# Patient Record
Sex: Female | Born: 1965
Health system: Southern US, Community
[De-identification: ages and names within clinical notes are randomized; demographics above are authoritative.]

## PROBLEM LIST (undated history)

## (undated) DIAGNOSIS — E039 Hypothyroidism, unspecified: Secondary | ICD-10-CM

## (undated) DIAGNOSIS — Z808 Family history of malignant neoplasm of other organs or systems: Secondary | ICD-10-CM

## (undated) DIAGNOSIS — F419 Anxiety disorder, unspecified: Secondary | ICD-10-CM

## (undated) DIAGNOSIS — I10 Essential (primary) hypertension: Secondary | ICD-10-CM

## (undated) DIAGNOSIS — R011 Cardiac murmur, unspecified: Secondary | ICD-10-CM

## (undated) DIAGNOSIS — T7840XA Allergy, unspecified, initial encounter: Secondary | ICD-10-CM

## (undated) DIAGNOSIS — Z803 Family history of malignant neoplasm of breast: Secondary | ICD-10-CM

## (undated) DIAGNOSIS — Z8042 Family history of malignant neoplasm of prostate: Secondary | ICD-10-CM

## (undated) DIAGNOSIS — N390 Urinary tract infection, site not specified: Secondary | ICD-10-CM

## (undated) DIAGNOSIS — C50919 Malignant neoplasm of unspecified site of unspecified female breast: Secondary | ICD-10-CM

## (undated) HISTORY — DX: Essential (primary) hypertension: I10

## (undated) HISTORY — PX: COSMETIC SURGERY: SHX468

## (undated) HISTORY — DX: Urinary tract infection, site not specified: N39.0

## (undated) HISTORY — DX: Cardiac murmur, unspecified: R01.1

## (undated) HISTORY — PX: BREAST SURGERY: SHX581

## (undated) HISTORY — DX: Allergy, unspecified, initial encounter: T78.40XA

## (undated) HISTORY — DX: Anxiety disorder, unspecified: F41.9

## (undated) HISTORY — DX: Malignant neoplasm of unspecified site of unspecified female breast: C50.919

## (undated) HISTORY — DX: Family history of malignant neoplasm of breast: Z80.3

## (undated) HISTORY — PX: TUBAL LIGATION: SHX77

## (undated) HISTORY — DX: Family history of malignant neoplasm of prostate: Z80.42

## (undated) HISTORY — DX: Family history of malignant neoplasm of other organs or systems: Z80.8

## (undated) HISTORY — PX: ABDOMINAL HYSTERECTOMY: SHX81

## (undated) HISTORY — DX: Hypothyroidism, unspecified: E03.9

---

## 2001-12-10 ENCOUNTER — Ambulatory Visit (HOSPITAL_COMMUNITY): Admission: RE | Admit: 2001-12-10 | Discharge: 2001-12-10 | Payer: Self-pay | Admitting: Obstetrics and Gynecology

## 2001-12-10 ENCOUNTER — Encounter: Payer: Self-pay | Admitting: Obstetrics and Gynecology

## 2004-05-18 ENCOUNTER — Other Ambulatory Visit: Admission: RE | Admit: 2004-05-18 | Discharge: 2004-05-18 | Payer: Self-pay | Admitting: Obstetrics and Gynecology

## 2006-06-27 ENCOUNTER — Other Ambulatory Visit: Admission: RE | Admit: 2006-06-27 | Discharge: 2006-06-27 | Payer: Self-pay | Admitting: Obstetrics and Gynecology

## 2006-12-31 ENCOUNTER — Inpatient Hospital Stay (HOSPITAL_COMMUNITY): Admission: RE | Admit: 2006-12-31 | Discharge: 2007-01-03 | Payer: Self-pay | Admitting: Obstetrics and Gynecology

## 2007-01-06 ENCOUNTER — Encounter: Admission: RE | Admit: 2007-01-06 | Discharge: 2007-01-28 | Payer: Self-pay | Admitting: Obstetrics and Gynecology

## 2010-10-16 ENCOUNTER — Encounter: Payer: Self-pay | Admitting: Obstetrics and Gynecology

## 2013-05-14 ENCOUNTER — Other Ambulatory Visit: Payer: Self-pay | Admitting: Internal Medicine

## 2013-05-14 DIAGNOSIS — R220 Localized swelling, mass and lump, head: Secondary | ICD-10-CM

## 2013-05-20 ENCOUNTER — Other Ambulatory Visit: Payer: Self-pay

## 2013-05-20 ENCOUNTER — Ambulatory Visit
Admission: RE | Admit: 2013-05-20 | Discharge: 2013-05-20 | Disposition: A | Discharge status: Home / Self Care | Payer: BC Managed Care – PPO | Source: Ambulatory Visit | Attending: Internal Medicine | Admitting: Internal Medicine

## 2013-05-20 DIAGNOSIS — R220 Localized swelling, mass and lump, head: Secondary | ICD-10-CM

## 2014-10-26 ENCOUNTER — Encounter: Payer: Self-pay | Admitting: Internal Medicine

## 2014-10-26 ENCOUNTER — Other Ambulatory Visit (INDEPENDENT_AMBULATORY_CARE_PROVIDER_SITE_OTHER): Payer: 59

## 2014-10-26 ENCOUNTER — Ambulatory Visit (INDEPENDENT_AMBULATORY_CARE_PROVIDER_SITE_OTHER): Payer: 59 | Admitting: Internal Medicine

## 2014-10-26 VITALS — BP 138/92 | HR 71 | Temp 97.6°F | Resp 16 | Ht 64.0 in | Wt 137.0 lb

## 2014-10-26 DIAGNOSIS — K117 Disturbances of salivary secretion: Secondary | ICD-10-CM | POA: Insufficient documentation

## 2014-10-26 DIAGNOSIS — R682 Dry mouth, unspecified: Secondary | ICD-10-CM

## 2014-10-26 DIAGNOSIS — R5382 Chronic fatigue, unspecified: Secondary | ICD-10-CM | POA: Insufficient documentation

## 2014-10-26 DIAGNOSIS — R599 Enlarged lymph nodes, unspecified: Secondary | ICD-10-CM

## 2014-10-26 DIAGNOSIS — R591 Generalized enlarged lymph nodes: Secondary | ICD-10-CM

## 2014-10-26 LAB — COMPREHENSIVE METABOLIC PANEL
ALBUMIN: 5 g/dL (ref 3.5–5.2)
ALT: 12 U/L (ref 0–35)
AST: 17 U/L (ref 0–37)
Alkaline Phosphatase: 52 U/L (ref 39–117)
BILIRUBIN TOTAL: 0.7 mg/dL (ref 0.2–1.2)
BUN: 12 mg/dL (ref 6–23)
CHLORIDE: 102 meq/L (ref 96–112)
CO2: 25 meq/L (ref 19–32)
CREATININE: 0.72 mg/dL (ref 0.40–1.20)
Calcium: 9.9 mg/dL (ref 8.4–10.5)
GFR: 91.41 mL/min (ref >60.00)
Glucose, Bld: 108 mg/dL — ABNORMAL HIGH (ref 70–99)
POTASSIUM: 4.3 meq/L (ref 3.5–5.1)
SODIUM: 137 meq/L (ref 135–145)
Total Protein: 7.8 g/dL (ref 6.0–8.3)

## 2014-10-26 LAB — TSH: TSH: 2.93 u[IU]/mL (ref 0.35–4.50)

## 2014-10-26 LAB — CBC
HCT: 38.7 % (ref 36.0–46.0)
Hemoglobin: 13.1 g/dL (ref 12.0–15.0)
MCHC: 33.9 g/dL (ref 30.0–36.0)
MCV: 82.4 fl (ref 78.0–100.0)
PLATELETS: 272 10*3/uL (ref 150.0–400.0)
RBC: 4.7 Mil/uL (ref 3.87–5.11)
RDW: 14.5 % (ref 11.5–15.5)
WBC: 5.2 10*3/uL (ref 4.0–10.5)

## 2014-10-26 LAB — LIPID PANEL
CHOLESTEROL: 214 mg/dL — AB (ref 0–200)
HDL: 81.8 mg/dL (ref >39.00)
LDL Cholesterol: 113 mg/dL — ABNORMAL HIGH (ref 0–99)
NonHDL: 132.2
TRIGLYCERIDES: 96 mg/dL (ref 0.0–149.0)
Total CHOL/HDL Ratio: 3
VLDL: 19.2 mg/dL (ref 0.0–40.0)

## 2014-10-26 LAB — HEMOGLOBIN A1C: HEMOGLOBIN A1C: 6 % (ref 4.6–6.5)

## 2014-10-26 LAB — HIV ANTIBODY (ROUTINE TESTING W REFLEX): HIV 1&2 Ab, 4th Generation: NONREACTIVE

## 2014-10-26 NOTE — Assessment & Plan Note (Addendum)
For details about constellation of symptoms see note. Unclear to me if she needs the adderall or if some other medication would be more appropriate. Check CBC, HIV, CMP, HgA1c, TSH. [place] from 2014 reviewed and no abnormalities in the appearance of the thyroid or any lymph nodes noted. Cannot feel any lymph nodes on exam and no signs of ENT infection. She has not seen a doctor in a long time and is overdue for screening which we will discuss more later this month when she comes for her new patient evaluation.

## 2014-10-26 NOTE — Progress Notes (Signed)
Subjective:    Patient ID: Ariel Tran, female    DOB: Mar 21, 1966, 49 y.o.   MRN: [id number]  HPI The patient is a 49 YO female who is coming in today for an acute complaint which has been going on for 2 years. Unable to review family medical history as patient has not filled out and has never been here before but she says that "there is a lot of cancer in the family". She had an episode of mono about 2 years ago and since then has been having what she describes as swelling in her lymph nodes in her neck and around her collar bone (for the last 3-4 months). This pain keeps her awake at night time. It is 4-5/10 and is an aching pain. She has also noticed that she has felt very fatigued and constipated (struggled with her whole life but worse lately). Most of her care has been at various urgent cares and am unable to review any of these records. It is listed in our computer that she had neck US in 2014 that showed normal thyroid and no lymph nodes. She denies associated fevers, chills, weight loss. She has gained some weight lately (unable to quantify how much). She also is having the problem of dry mouth. It is unclear how long this has been going on but she does have quite a few dental problems (has had problems most of her life) and bad breath noted by several family members. Her dentist told her to get checked for some cause of the dry mouth. She does take adderall for ADHD at times (not regularly).  Review of Systems  Constitutional: Positive for fatigue and unexpected weight change. Negative for fever, chills, activity change and appetite change.  Eyes: Negative.   Respiratory: Negative for cough, chest tightness, shortness of breath and wheezing.   Cardiovascular: Negative for chest pain, palpitations and leg swelling.  Gastrointestinal: Positive for constipation. Negative for nausea, abdominal pain, diarrhea and abdominal distention.  Endocrine: Negative.   Musculoskeletal: Positive for  myalgias and neck pain. Negative for arthralgias, gait problem and neck stiffness.  Skin: Negative.   Neurological: Positive for headaches. Negative for dizziness, weakness and light-headedness.  Psychiatric/Behavioral: Positive for sleep disturbance and decreased concentration. Negative for suicidal ideas, confusion, self-injury, dysphoric mood and agitation. The patient is hyperactive.       Objective:   Physical Exam  Constitutional: She is oriented to person, place, and time. She appears well-developed and well-nourished.  HENT:  Head: Normocephalic and atraumatic.  Right Ear: External ear normal.  Left Ear: External ear normal.  Nose: Nose normal.  Oropharynx slightly dry  Eyes: EOM are normal.  Neck: Normal range of motion. Neck supple. No tracheal deviation present. No thyromegaly present.  Cardiovascular: Normal rate and regular rhythm.   Pulmonary/Chest: Effort normal and breath sounds normal. No respiratory distress. She has no wheezes. She has no rales.  Abdominal: Soft. Bowel sounds are normal.  Musculoskeletal: She exhibits no edema.  Lymphadenopathy:    She has no cervical adenopathy.  Neurological: She is alert and oriented to person, place, and time. Coordination normal.  Skin: Skin is warm and dry.  3 areas of scab on the back of her neck, no adenopathy noted over the region where she was tender.   Psychiatric:  Speech slightly pressured and scattered   Filed Vitals:   10/26/14 0816  BP: 138/92  Pulse: 71  Temp: 97.6 F (36.4 C)  TempSrc: Oral  Resp: 16  Height: 5\' 4"  (1.626 m)  Weight: 137 lb (62.143 kg)  SpO2: 97%      Assessment & Plan:

## 2014-10-26 NOTE — Patient Instructions (Signed)
We do not think that you need antibiotics today. Your lymph nodes are not swollen in your neck or around your collar bone. We will check blood work today to make sure we are not missing a source of infection or the dry mouth.   We can discuss the results with you on the phone and when you come back later this month.   We can advised heating pads on your neck and collar bone to see if it helps with the pain. Likely the adderall is causing the dry mouth and it may be worthwhile to see if you can come off it.   Sore or Dry Mouth Care A sore or dry mouth may happen for many different reasons. Sometimes, treatment for other health problems may have to stop until your sore or dry mouth gets better.  HOME CARE  Do not smoke or chew tobacco.  Use fake (artificial) saliva when your mouth feels dry.  Use a humidifier in your bedroom at night.  Eat small meals and snacks.  Eat food cold or at room temperature.  Suck on ice-chips or try frozen ice pops or juice bars, ice-cream, and watermelon. Do not have citrus flavors.  Suck on hard, sugarless, sour candy, or chew sugarless gum to help make more saliva.  Eat soft foods such as yogurt, bananas, canned fruit, mashed potatoes, oatmeal, rice, eggs, cottage cheese, macaroni and cheese, jello, and pudding.  Microwave vegetables and fruits to soften them.  Puree cooked food in a blender if needed.  Make dry food moist by using olive oil, gravy, or mild sauces. Dip foods in liquids.  Keep a glass of water or squirt bottle nearby. Take sips often throughout the day.  Limit caffeine.  Avoid:  Pop or fizzy drinks.  Alcohol.  Citrus juices.  Acidic food.  Salty or spicy food.  Foods or drinks that are very hot.  Hard or crunchy food. Mouth Care  Wash your hands well with soap and water before doing mouth care.  Use fake saliva as told by your doctor.  Use medicine on the sore places.  Brush your teeth at least 2 times a day.  Brush after each meal if possible. Rinse your mouth with water after each meal and after drinking a sweet drink.  Brush slowly and gently in small circles. Do not brush side-to-side.  Use regular toothpastes, but stay away from ones that have sodium Nelta sulfate in them.  Gargle with a baking soda mouthwash ( teaspoon baking soda mixed in with 4 cups of water).  Gargle with medicated mouthwash.  Use dental floss or dental tape to clean between your teeth every day.  Use a lanolin-based lip balm to keep your lips from getting dry.  If you wear dentures or bridges:  You may need to leave them out until your doctor tells you to start wearing them again.  Take them out at night if you wear them daily. Soak them in warm water or denture solution. Take your dentures out as much as you can during the day. Take them out when you use mouthwash.  After each meal, brush your gums gently with a soft brush and rinse your mouth with water.  If your dentures rub on your gums and cause a sore spot, have your dentist check and fix your dentures right away. GET HELP RIGHT AWAY IF:   Your mouth gets more painful or dry.  You have questions. MAKE SURE YOU:  Understand these instructions.  Will watch your condition.  Will get help right away if you are not doing well or get worse. Document Released: 07/09/2009 Document Revised: 12/04/2011 Document Reviewed: 07/09/2009 [facility] Patient Information 2015 [place], [place]. This information is not intended to replace advice given to you by your health care provider. Make sure you discuss any questions you have with your health care provider.

## 2014-10-26 NOTE — Assessment & Plan Note (Signed)
Unable to find any true lymph nodes on exam which are enlarged. Will await lab work up. Concern for HIV with lymphadenopathy or blood dyscrasia and check CBC.

## 2014-10-26 NOTE — Progress Notes (Signed)
Pre visit review using our clinic review tool, if applicable. No additional management support is needed unless otherwise documented below in the visit note. 

## 2014-10-26 NOTE — Assessment & Plan Note (Addendum)
Talked with her about the fact that this is likely a medication side effect from adderall (long term use 35% experience). Have asked her to stop taking it if she is able. She does have dental problems and wondering if some TMJ or dental problems could be causing the pain around her ears. She can continue using biotene.

## 2014-11-18 ENCOUNTER — Ambulatory Visit (INDEPENDENT_AMBULATORY_CARE_PROVIDER_SITE_OTHER): Payer: 59 | Admitting: Internal Medicine

## 2014-11-18 ENCOUNTER — Encounter: Payer: Self-pay | Admitting: Internal Medicine

## 2014-11-18 VITALS — BP 128/82 | HR 60 | Temp 98.4°F | Resp 14 | Ht 64.0 in | Wt 134.0 lb

## 2014-11-18 DIAGNOSIS — K117 Disturbances of salivary secretion: Secondary | ICD-10-CM

## 2014-11-18 DIAGNOSIS — Z0000 Encounter for general adult medical examination without abnormal findings: Secondary | ICD-10-CM

## 2014-11-18 DIAGNOSIS — R682 Dry mouth, unspecified: Secondary | ICD-10-CM

## 2014-11-18 NOTE — Patient Instructions (Signed)
I will email an infectious disease doctor that I know to see if he has any recommendations for what we should do next.   We do not need any blood work today.   We will see you back next year for your physical or sooner if you have any problems or questions. The cholesterol is actually good and I think you can keep up with the good eating habits and exercising.  On your birthday this year you are due for a colon cancer screening or colonoscopy. We can talk more about it next time or if you want to come back after your birthday we can talk about it then.   Health Maintenance Adopting a healthy lifestyle and getting preventive care can go a long way to promote health and wellness. Talk with your health care provider about what schedule of regular examinations is right for you. This is a good chance for you to check in with your provider about disease prevention and staying healthy. In between checkups, there are plenty of things you can do on your own. Experts have done a lot of research about which lifestyle changes and preventive measures are most likely to keep you healthy. Ask your health care provider for more information. WEIGHT AND DIET  Eat a healthy diet  Be sure to include plenty of vegetables, fruits, low-fat dairy products, and lean protein.  Do not eat a lot of foods high in solid fats, added sugars, or salt.  Get regular exercise. This is one of the most important things you can do for your health.  Most adults should exercise for at least 150 minutes each week. The exercise should increase your heart rate and make you sweat (moderate-intensity exercise).  Most adults should also do strengthening exercises at least twice a week. This is in addition to the moderate-intensity exercise.  Maintain a healthy weight  Body mass index (BMI) is a measurement that can be used to identify possible weight problems. It estimates body fat based on height and weight. Your health care provider  can help determine your BMI and help you achieve or maintain a healthy weight.  For females 34 years of age and older:   A BMI below 18.5 is considered underweight.  A BMI of 18.5 to 24.9 is normal.  A BMI of 25 to 29.9 is considered overweight.  A BMI of 30 and above is considered obese.  Watch levels of cholesterol and blood lipids  You should start having your blood tested for lipids and cholesterol at 50 years of age, then have this test every 5 years.  You may need to have your cholesterol levels checked more often if:  Your lipid or cholesterol levels are high.  You are older than 50 years of age.  You are at high risk for heart disease.  CANCER SCREENING   Lung Cancer  Lung cancer screening is recommended for adults 75-51 years old who are at high risk for lung cancer because of a history of smoking.  A yearly low-dose CT scan of the lungs is recommended for people who:  Currently smoke.  Have quit within the past 15 years.  Have at least a 30-pack-year history of smoking. A pack year is smoking an average of one pack of cigarettes a day for 1 year.  Yearly screening should continue until it has been 15 years since you quit.  Yearly screening should stop if you develop a health problem that would prevent you from having lung cancer  treatment.  Breast Cancer  Practice breast self-awareness. This means understanding how your breasts normally appear and feel.  It also means doing regular breast self-exams. Let your health care provider know about any changes, no matter how small.  If you are in your 20s or 30s, you should have a clinical breast exam (CBE) by a health care provider every 1-3 years as part of a regular health exam.  If you are 74 or older, have a CBE every year. Also consider having a breast X-ray (mammogram) every year.  If you have a family history of breast cancer, talk to your health care provider about genetic screening.  If you are at  high risk for breast cancer, talk to your health care provider about having an MRI and a mammogram every year.  Breast cancer gene (BRCA) assessment is recommended for women who have family members with BRCA-related cancers. BRCA-related cancers include:  Breast.  Ovarian.  Tubal.  Peritoneal cancers.  Results of the assessment will determine the need for genetic counseling and BRCA1 and BRCA2 testing. Cervical Cancer Routine pelvic examinations to screen for cervical cancer are no longer recommended for nonpregnant women who are considered low risk for cancer of the pelvic organs (ovaries, uterus, and vagina) and who do not have symptoms. A pelvic examination may be necessary if you have symptoms including those associated with pelvic infections. Ask your health care provider if a screening pelvic exam is right for you.   The Pap test is the screening test for cervical cancer for women who are considered at risk.  If you had a hysterectomy for a problem that was not cancer or a condition that could lead to cancer, then you no longer need Pap tests.  If you are older than 65 years, and you have had normal Pap tests for the past 10 years, you no longer need to have Pap tests.  If you have had past treatment for cervical cancer or a condition that could lead to cancer, you need Pap tests and screening for cancer for at least 20 years after your treatment.  If you no longer get a Pap test, assess your risk factors if they change (such as having a new sexual partner). This can affect whether you should start being screened again.  Some women have medical problems that increase their chance of getting cervical cancer. If this is the case for you, your health care provider may recommend more frequent screening and Pap tests.  The human papillomavirus (HPV) test is another test that may be used for cervical cancer screening. The HPV test looks for the virus that can cause cell changes in the  cervix. The cells collected during the Pap test can be tested for HPV.  The HPV test can be used to screen women 40 years of age and older. Getting tested for HPV can extend the interval between normal Pap tests from three to five years.  An HPV test also should be used to screen women of any age who have unclear Pap test results.  After 50 years of age, women should have HPV testing as often as Pap tests.  Colorectal Cancer  This type of cancer can be detected and often prevented.  Routine colorectal cancer screening usually begins at 50 years of age and continues through 50 years of age.  Your health care provider may recommend screening at an earlier age if you have risk factors for colon cancer.  Your health care provider may also  recommend using home test kits to check for hidden blood in the stool.  A small camera at the end of a tube can be used to examine your colon directly (sigmoidoscopy or colonoscopy). This is done to check for the earliest forms of colorectal cancer.  Routine screening usually begins at age 26.  Direct examination of the colon should be repeated every 5-10 years through 50 years of age. However, you may need to be screened more often if early forms of precancerous polyps or small growths are found. Skin Cancer  Check your skin from head to toe regularly.  Tell your health care provider about any new moles or changes in moles, especially if there is a change in a mole's shape or color.  Also tell your health care provider if you have a mole that is larger than the size of a pencil eraser.  Always use sunscreen. Apply sunscreen liberally and repeatedly throughout the day.  Protect yourself by wearing long sleeves, pants, a wide-brimmed hat, and sunglasses whenever you are outside. HEART DISEASE, DIABETES, AND HIGH BLOOD PRESSURE   Have your blood pressure checked at least every 1-2 years. High blood pressure causes heart disease and increases the risk  of stroke.  If you are between 52 years and 58 years old, ask your health care provider if you should take aspirin to prevent strokes.  Have regular diabetes screenings. This involves taking a blood sample to check your fasting blood sugar level.  If you are at a normal weight and have a low risk for diabetes, have this test once every three years after 50 years of age.  If you are overweight and have a high risk for diabetes, consider being tested at a younger age or more often. PREVENTING INFECTION  Hepatitis B  If you have a higher risk for hepatitis B, you should be screened for this virus. You are considered at high risk for hepatitis B if:  You were born in a country where hepatitis B is common. Ask your health care provider which countries are considered high risk.  Your parents were born in a high-risk country, and you have not been immunized against hepatitis B (hepatitis B vaccine).  You have HIV or AIDS.  You use needles to inject street drugs.  You live with someone who has hepatitis B.  You have had sex with someone who has hepatitis B.  You get hemodialysis treatment.  You take certain medicines for conditions, including cancer, organ transplantation, and autoimmune conditions. Hepatitis C  Blood testing is recommended for:  Everyone born from 50 through 1966.  Anyone with known risk factors for hepatitis C. Sexually transmitted infections (STIs)  You should be screened for sexually transmitted infections (STIs) including gonorrhea and chlamydia if:  You are sexually active and are younger than 50 years of age.  You are older than 50 years of age and your health care provider tells you that you are at risk for this type of infection.  Your sexual activity has changed since you were last screened and you are at an increased risk for chlamydia or gonorrhea. Ask your health care provider if you are at risk.  If you do not have HIV, but are at risk, it may be  recommended that you take a prescription medicine daily to prevent HIV infection. This is called pre-exposure prophylaxis (PrEP). You are considered at risk if:  You are sexually active and do not regularly use condoms or know the HIV status  of your partner(s).  You take drugs by injection.  You are sexually active with a partner who has HIV. Talk with your health care provider about whether you are at high risk of being infected with HIV. If you choose to begin PrEP, you should first be tested for HIV. You should then be tested every 3 months for as long as you are taking PrEP.  PREGNANCY   If you are premenopausal and you may become pregnant, ask your health care provider about preconception counseling.  If you may become pregnant, take 400 to 800 micrograms (mcg) of folic acid every day.  If you want to prevent pregnancy, talk to your health care provider about birth control (contraception). OSTEOPOROSIS AND MENOPAUSE   Osteoporosis is a disease in which the bones lose minerals and strength with aging. This can result in serious bone fractures. Your risk for osteoporosis can be identified using a bone density scan.  If you are 23 years of age or older, or if you are at risk for osteoporosis and fractures, ask your health care provider if you should be screened.  Ask your health care provider whether you should take a calcium or vitamin D supplement to lower your risk for osteoporosis.  Menopause may have certain physical symptoms and risks.  Hormone replacement therapy may reduce some of these symptoms and risks. Talk to your health care provider about whether hormone replacement therapy is right for you.  HOME CARE INSTRUCTIONS   Schedule regular health, dental, and eye exams.  Stay current with your immunizations.   Do not use any tobacco products including cigarettes, chewing tobacco, or electronic cigarettes.  If you are pregnant, do not drink alcohol.  If you are  breastfeeding, limit how much and how often you drink alcohol.  Limit alcohol intake to no more than 1 drink per day for nonpregnant women. One drink equals 12 ounces of beer, 5 ounces of wine, or 1 ounces of hard liquor.  Do not use street drugs.  Do not share needles.  Ask your health care provider for help if you need support or information about quitting drugs.  Tell your health care provider if you often feel depressed.  Tell your health care provider if you have ever been abused or do not feel safe at home. Document Released: 03/27/2011 Document Revised: 01/26/2014 Document Reviewed: 08/13/2013 [facility] Patient Information 2015 [place], [place]. This information is not intended to replace advice given to you by your health care provider. Make sure you discuss any questions you have with your health care provider.

## 2014-11-18 NOTE — Progress Notes (Signed)
Pre visit review using our clinic review tool, if applicable. No additional management support is needed unless otherwise documented below in the visit note. 

## 2014-11-19 ENCOUNTER — Encounter: Payer: Self-pay | Admitting: Internal Medicine

## 2014-11-19 DIAGNOSIS — Z0000 Encounter for general adult medical examination without abnormal findings: Secondary | ICD-10-CM | POA: Insufficient documentation

## 2014-11-19 NOTE — Assessment & Plan Note (Signed)
Declines flu and tetanus today. Spoke with her about colon cancer screening at 72. Labs reviewed with patient during our visit. BP okay, non-smoker. Exercises regularly and eats healthy. Reminded that she is overdue for pap smear. Talked with her about mammogram at 22 as well.

## 2014-11-19 NOTE — Progress Notes (Signed)
   Subjective:    Patient ID: Ariel Tran, female    DOB: 11/30/1965, 49 y.o.   MRN: [id number]  HPI The patient is a 49 YO female who is coming in for wellness. She is still having the same problem with dryness of her mouth but has been unable to stop her adderall (states her adhd specialist told her she was not allowed to drive without it). She is also still having the same pains in the neck region behind her ear. She was concerned about her lab results from last visit and so we reviewed those in detail. No other new complaints.   Review of Systems  Constitutional: Positive for fatigue. Negative for fever, chills, activity change and appetite change.  Eyes: Negative.   Respiratory: Negative for cough, chest tightness, shortness of breath and wheezing.   Cardiovascular: Negative for chest pain, palpitations and leg swelling.  Gastrointestinal: Negative for nausea, abdominal pain, diarrhea and abdominal distention.  Endocrine: Negative.   Musculoskeletal: Positive for myalgias and neck pain. Negative for arthralgias, gait problem and neck stiffness.  Skin: Negative.   Neurological: Negative for dizziness, weakness and light-headedness.  Psychiatric/Behavioral: Positive for sleep disturbance and decreased concentration. Negative for suicidal ideas, confusion, self-injury, dysphoric mood and agitation. The patient is hyperactive.       Objective:   Physical Exam  Constitutional: She is oriented to person, place, and time. She appears well-developed and well-nourished.  HENT:  Head: Normocephalic and atraumatic.  Right Ear: External ear normal.  Left Ear: External ear normal.  Nose: Nose normal.  Oropharynx slightly dry  Eyes: EOM are normal.  Neck: Normal range of motion. Neck supple. No tracheal deviation present. No thyromegaly present.  Cardiovascular: Normal rate and regular rhythm.   Pulmonary/Chest: Effort normal and breath sounds normal. No respiratory distress. She has no  wheezes. She has no rales.  Abdominal: Soft. Bowel sounds are normal.  Musculoskeletal: She exhibits no edema.  Lymphadenopathy:    She has no cervical adenopathy.  Neurological: She is alert and oriented to person, place, and time. Coordination normal.  Skin: Skin is warm and dry.  no adenopathy noted over the region where she was tender.   Psychiatric:  Speech slightly pressured and scattered   Filed Vitals:   11/18/14 1112  BP: 128/82  Pulse: 60  Temp: 98.4 F (36.9 C)  TempSrc: Oral  Resp: 14  Height: 5\' 4"  (1.626 m)  Weight: 134 lb (60.782 kg)  SpO2: 99%      Assessment & Plan:  Patient declined immunizations today.

## 2014-11-19 NOTE — Assessment & Plan Note (Signed)
Has not been able to stop adderall and she has no intention of stopping. Reminded her that this could be the cause of her symptoms.

## 2015-01-12 ENCOUNTER — Encounter: Payer: Self-pay | Admitting: Internal Medicine

## 2015-01-12 ENCOUNTER — Ambulatory Visit (INDEPENDENT_AMBULATORY_CARE_PROVIDER_SITE_OTHER): Payer: 59 | Admitting: Internal Medicine

## 2015-01-12 VITALS — BP 122/82 | HR 80 | Temp 98.6°F | Ht 64.0 in | Wt 131.5 lb

## 2015-01-12 DIAGNOSIS — J0110 Acute frontal sinusitis, unspecified: Secondary | ICD-10-CM | POA: Diagnosis not present

## 2015-01-12 MED ORDER — AMOXICILLIN 500 MG PO CAPS: 500.0000 mg | ORAL_CAPSULE | Freq: Three times a day (TID) | ORAL | Status: DC

## 2015-01-12 NOTE — Progress Notes (Signed)
   Subjective:    Patient ID: Ariel Tran, female    DOB: [birth year], 49 y.o.   MRN: [id number]  HPI Her symptoms began 01/07/15 as allergy symptoms. She subsequently has developed nasal congestion, nasal obstruction, nasal purulence, fatigue, frontal headache, and otic pain. Additionally she has a significant sore throat and nonproductive cough. She has developed laryngitis. She's continued to have some itchy, watery eyes.  She's been using zinc for the sore throat as well as Nasacort, Allegra, nasal saline, and Advil.  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  Review of Systems She is not having fever, chills, or sweats. No dyspnea or wheezing.     Objective:   Physical Exam General appearance:Adequately nourished; no acute distress or increased work of breathing is present. She's markedly hoarse with a raspy voice.  Lymphatic: No  lymphadenopathy about the head, neck, or axilla .  Eyes: No conjunctival inflammation or lid edema is present. There is no scleral icterus.  Ears:  External ear exam shows no significant lesions or deformities.  Otoscopic examination reveals clear canals, tympanic membranes are intact bilaterally without bulging, retraction, inflammation or discharge.  Nose:  External nasal examination shows no deformity or inflammation.   There is marked erythema of the nasal septum.No septal dislocation or deviation.No obstruction to airflow.   Oral exam: Dental hygiene is good; lips and gums are healthy appearing.There is no oropharyngeal erythema or exudate .  Neck:  No deformities, thyromegaly, masses, or tenderness noted.   Supple with full range of motion without pain.   Heart:  Normal rate and regular rhythm. S1 and S2 normal without gallop, murmur, click, rub or other extra sounds.   Lungs:Chest clear to auscultation; no wheezes, rhonchi,rales ,or rubs present.  Extremities:  No cyanosis, edema, or clubbing  noted    Skin: Warm  & dry w/o tenting or jaundice. No significant lesions or rash.     Assessment & Plan:  #1 acute frontal sinusitis  #2 allergic rhinitis  Plan: See orders and recommendations

## 2015-01-12 NOTE — Patient Instructions (Signed)

## 2015-01-12 NOTE — Progress Notes (Signed)
Pre visit review using our clinic review tool, if applicable. No additional management support is needed unless otherwise documented below in the visit note. 

## 2015-04-05 ENCOUNTER — Encounter: Payer: Self-pay | Admitting: Internal Medicine

## 2015-04-05 DIAGNOSIS — R42 Dizziness and giddiness: Secondary | ICD-10-CM

## 2015-12-27 ENCOUNTER — Telehealth: Payer: Self-pay | Admitting: Internal Medicine

## 2015-12-27 NOTE — Telephone Encounter (Signed)
I have scheduled pt an appointment through [facility] on 5/1.  She replied back    "I would like to have lab work done too! I'd like to do 3 blood test on my thyroid please, t3, t4 and Tsh. Also my A1 test again and a complete blood count. Can I come in early one morning before my appointment? Like 7am? That would be great! Blood test take awhile. If I can't then I will wait. Thanks again, [place]" I will let her know Dr. Sharlet Salina is out for the week and we will call her back with Dr. Nathanial Millman response.

## 2015-12-28 NOTE — Telephone Encounter (Signed)
Called and left a message on voice mail informing patient that Dr. Sharlet Salina will order any labs during the office visit.

## 2016-01-24 ENCOUNTER — Encounter: Payer: Self-pay | Admitting: Internal Medicine

## 2016-01-24 ENCOUNTER — Ambulatory Visit (INDEPENDENT_AMBULATORY_CARE_PROVIDER_SITE_OTHER): Payer: 59 | Admitting: Internal Medicine

## 2016-01-24 VITALS — BP 112/64 | HR 78 | Temp 98.6°F | Resp 14 | Ht 64.0 in | Wt 134.0 lb

## 2016-01-24 DIAGNOSIS — Z0000 Encounter for general adult medical examination without abnormal findings: Secondary | ICD-10-CM

## 2016-01-24 NOTE — Progress Notes (Signed)
Pre visit review using our clinic review tool, if applicable. No additional management support is needed unless otherwise documented below in the visit note. 

## 2016-01-24 NOTE — Patient Instructions (Signed)
We will have you come back before work for the labs some day. Our lab opens at 7:30 AM.   Think about getting the colon cancer screening done if your insurance will cover it.   Health Maintenance, Female Adopting a healthy lifestyle and getting preventive care can go a long way to promote health and wellness. Talk with your health care provider about what schedule of regular examinations is right for you. This is a good chance for you to check in with your provider about disease prevention and staying healthy. In between checkups, there are plenty of things you can do on your own. Experts have done a lot of research about which lifestyle changes and preventive measures are most likely to keep you healthy. Ask your health care provider for more information. WEIGHT AND DIET  Eat a healthy diet  Be sure to include plenty of vegetables, fruits, low-fat dairy products, and lean protein.  Do not eat a lot of foods high in solid fats, added sugars, or salt.  Get regular exercise. This is one of the most important things you can do for your health.  Most adults should exercise for at least 150 minutes each week. The exercise should increase your heart rate and make you sweat (moderate-intensity exercise).  Most adults should also do strengthening exercises at least twice a week. This is in addition to the moderate-intensity exercise.  Maintain a healthy weight  Body mass index (BMI) is a measurement that can be used to identify possible weight problems. It estimates body fat based on height and weight. Your health care provider can help determine your BMI and help you achieve or maintain a healthy weight.  For females 90 years of age and older:   A BMI below 18.5 is considered underweight.  A BMI of 18.5 to 24.9 is normal.  A BMI of 25 to 29.9 is considered overweight.  A BMI of 30 and above is considered obese.  Watch levels of cholesterol and blood lipids  You should start having your  blood tested for lipids and cholesterol at 51 years of age, then have this test every 5 years.  You may need to have your cholesterol levels checked more often if:  Your lipid or cholesterol levels are high.  You are older than 51 years of age.  You are at high risk for heart disease.  CANCER SCREENING   Lung Cancer  Lung cancer screening is recommended for adults 5-36 years old who are at high risk for lung cancer because of a history of smoking.  A yearly low-dose CT scan of the lungs is recommended for people who:  Currently smoke.  Have quit within the past 15 years.  Have at least a 30-pack-year history of smoking. A pack year is smoking an average of one pack of cigarettes a day for 1 year.  Yearly screening should continue until it has been 15 years since you quit.  Yearly screening should stop if you develop a health problem that would prevent you from having lung cancer treatment.  Breast Cancer  Practice breast self-awareness. This means understanding how your breasts normally appear and feel.  It also means doing regular breast self-exams. Let your health care provider know about any changes, no matter how small.  If you are in your 20s or 30s, you should have a clinical breast exam (CBE) by a health care provider every 1-3 years as part of a regular health exam.  If you are 40 or  older, have a CBE every year. Also consider having a breast X-ray (mammogram) every year.  If you have a family history of breast cancer, talk to your health care provider about genetic screening.  If you are at high risk for breast cancer, talk to your health care provider about having an MRI and a mammogram every year.  Breast cancer gene (BRCA) assessment is recommended for women who have family members with BRCA-related cancers. BRCA-related cancers include:  Breast.  Ovarian.  Tubal.  Peritoneal cancers.  Results of the assessment will determine the need for genetic  counseling and BRCA1 and BRCA2 testing. Cervical Cancer Your health care provider may recommend that you be screened regularly for cancer of the pelvic organs (ovaries, uterus, and vagina). This screening involves a pelvic examination, including checking for microscopic changes to the surface of your cervix (Pap test). You may be encouraged to have this screening done every 3 years, beginning at age 55.  For women ages 72-65, health care providers may recommend pelvic exams and Pap testing every 3 years, or they may recommend the Pap and pelvic exam, combined with testing for human papilloma virus (HPV), every 5 years. Some types of HPV increase your risk of cervical cancer. Testing for HPV may also be done on women of any age with unclear Pap test results.  Other health care providers may not recommend any screening for nonpregnant women who are considered low risk for pelvic cancer and who do not have symptoms. Ask your health care provider if a screening pelvic exam is right for you.  If you have had past treatment for cervical cancer or a condition that could lead to cancer, you need Pap tests and screening for cancer for at least 20 years after your treatment. If Pap tests have been discontinued, your risk factors (such as having a new sexual partner) need to be reassessed to determine if screening should resume. Some women have medical problems that increase the chance of getting cervical cancer. In these cases, your health care provider may recommend more frequent screening and Pap tests. Colorectal Cancer  This type of cancer can be detected and often prevented.  Routine colorectal cancer screening usually begins at 51 years of age and continues through 51 years of age.  Your health care provider may recommend screening at an earlier age if you have risk factors for colon cancer.  Your health care provider may also recommend using home test kits to check for hidden blood in the stool.  A  small camera at the end of a tube can be used to examine your colon directly (sigmoidoscopy or colonoscopy). This is done to check for the earliest forms of colorectal cancer.  Routine screening usually begins at age 40.  Direct examination of the colon should be repeated every 5-10 years through 51 years of age. However, you may need to be screened more often if early forms of precancerous polyps or small growths are found. Skin Cancer  Check your skin from head to toe regularly.  Tell your health care provider about any new moles or changes in moles, especially if there is a change in a mole's shape or color.  Also tell your health care provider if you have a mole that is larger than the size of a pencil eraser.  Always use sunscreen. Apply sunscreen liberally and repeatedly throughout the day.  Protect yourself by wearing long sleeves, pants, a wide-brimmed hat, and sunglasses whenever you are outside. HEART DISEASE, DIABETES,  AND HIGH BLOOD PRESSURE   High blood pressure causes heart disease and increases the risk of stroke. High blood pressure is more likely to develop in:  People who have blood pressure in the high end of the normal range (130-139/85-89 mm Hg).  People who are overweight or obese.  People who are African American.  If you are 19-7 years of age, have your blood pressure checked every 3-5 years. If you are 78 years of age or older, have your blood pressure checked every year. You should have your blood pressure measured twice--once when you are at a hospital or clinic, and once when you are not at a hospital or clinic. Record the average of the two measurements. To check your blood pressure when you are not at a hospital or clinic, you can use:  An automated blood pressure machine at a pharmacy.  A home blood pressure monitor.  If you are between 55 years and 34 years old, ask your health care provider if you should take aspirin to prevent strokes.  Have  regular diabetes screenings. This involves taking a blood sample to check your fasting blood sugar level.  If you are at a normal weight and have a low risk for diabetes, have this test once every three years after 52 years of age.  If you are overweight and have a high risk for diabetes, consider being tested at a younger age or more often. PREVENTING INFECTION  Hepatitis B  If you have a higher risk for hepatitis B, you should be screened for this virus. You are considered at high risk for hepatitis B if:  You were born in a country where hepatitis B is common. Ask your health care provider which countries are considered high risk.  Your parents were born in a high-risk country, and you have not been immunized against hepatitis B (hepatitis B vaccine).  You have HIV or AIDS.  You use needles to inject street drugs.  You live with someone who has hepatitis B.  You have had sex with someone who has hepatitis B.  You get hemodialysis treatment.  You take certain medicines for conditions, including cancer, organ transplantation, and autoimmune conditions. Hepatitis C  Blood testing is recommended for:  Everyone born from [birth year] through 1966.  Anyone with known risk factors for hepatitis C. Sexually transmitted infections (STIs)  You should be screened for sexually transmitted infections (STIs) including gonorrhea and chlamydia if:  You are sexually active and are younger than 51 years of age.  You are older than 52 years of age and your health care provider tells you that you are at risk for this type of infection.  Your sexual activity has changed since you were last screened and you are at an increased risk for chlamydia or gonorrhea. Ask your health care provider if you are at risk.  If you do not have HIV, but are at risk, it may be recommended that you take a prescription medicine daily to prevent HIV infection. This is called pre-exposure prophylaxis (PrEP). You are  considered at risk if:  You are sexually active and do not regularly use condoms or know the HIV status of your partner(s).  You take drugs by injection.  You are sexually active with a partner who has HIV. Talk with your health care provider about whether you are at high risk of being infected with HIV. If you choose to begin PrEP, you should first be tested for HIV. You should then be  tested every 3 months for as long as you are taking PrEP.  PREGNANCY   If you are premenopausal and you may become pregnant, ask your health care provider about preconception counseling.  If you may become pregnant, take 400 to 800 micrograms (mcg) of folic acid every day.  If you want to prevent pregnancy, talk to your health care provider about birth control (contraception). OSTEOPOROSIS AND MENOPAUSE   Osteoporosis is a disease in which the bones lose minerals and strength with aging. This can result in serious bone fractures. Your risk for osteoporosis can be identified using a bone density scan.  If you are 45 years of age or older, or if you are at risk for osteoporosis and fractures, ask your health care provider if you should be screened.  Ask your health care provider whether you should take a calcium or vitamin D supplement to lower your risk for osteoporosis.  Menopause may have certain physical symptoms and risks.  Hormone replacement therapy may reduce some of these symptoms and risks. Talk to your health care provider about whether hormone replacement therapy is right for you.  HOME CARE INSTRUCTIONS   Schedule regular health, dental, and eye exams.  Stay current with your immunizations.   Do not use any tobacco products including cigarettes, chewing tobacco, or electronic cigarettes.  If you are pregnant, do not drink alcohol.  If you are breastfeeding, limit how much and how often you drink alcohol.  Limit alcohol intake to no more than 1 drink per day for nonpregnant women. One  drink equals 12 ounces of beer, 5 ounces of wine, or 1 ounces of hard liquor.  Do not use street drugs.  Do not share needles.  Ask your health care provider for help if you need support or information about quitting drugs.  Tell your health care provider if you often feel depressed.  Tell your health care provider if you have ever been abused or do not feel safe at home.   This information is not intended to replace advice given to you by your health care provider. Make sure you discuss any questions you have with your health care provider.   Document Released: 03/27/2011 Document Revised: 10/02/2014 Document Reviewed: 08/13/2013 Elsevier Interactive Patient Education [employer].

## 2016-01-24 NOTE — Progress Notes (Signed)
   Subjective:    Patient ID: Ariel Tran, female    DOB: [birth year], 50 y.o.   MRN: [id number]  HPI The patient is a 50 YO female coming in for wellness. No new concerns since last visit.   PMH, [facility], social history reviewed and updated.   Review of Systems  Constitutional: Positive for fatigue. Negative for fever, chills, activity change, appetite change and unexpected weight change.  Eyes: Negative.   Respiratory: Negative for cough, chest tightness, shortness of breath and wheezing.   Cardiovascular: Negative for chest pain, palpitations and leg swelling.  Gastrointestinal: Negative for nausea, abdominal pain, diarrhea, constipation and abdominal distention.  Endocrine: Negative.   Musculoskeletal: Negative for arthralgias, gait problem and neck stiffness.  Skin: Negative.   Neurological: Negative for dizziness, weakness, light-headedness and headaches.  Psychiatric/Behavioral: Positive for sleep disturbance and decreased concentration. Negative for suicidal ideas, confusion, self-injury, dysphoric mood and agitation. The patient is hyperactive.       Objective:   Physical Exam  Constitutional: She is oriented to person, place, and time. She appears well-developed and well-nourished.  HENT:  Head: Normocephalic and atraumatic.  Right Ear: External ear normal.  Left Ear: External ear normal.  Nose: Nose normal.  Eyes: EOM are normal.  Neck: Normal range of motion. Neck supple. No tracheal deviation present. No thyromegaly present.  Cardiovascular: Normal rate and regular rhythm.   Pulmonary/Chest: Effort normal and breath sounds normal. No respiratory distress. She has no wheezes. She has no rales.  Abdominal: Soft. Bowel sounds are normal.  Musculoskeletal: She exhibits no edema.  Lymphadenopathy:    She has no cervical adenopathy.  Neurological: She is alert and oriented to person, place, and time. Coordination normal.  Skin: Skin is warm and dry.   Filed Vitals:   01/24/16 1440  BP: 112/64  Pulse: 78  Temp: 98.6 F (37 C)  TempSrc: Oral  Resp: 14  Height: 5\' 4"  (1.626 m)  Weight: 134 lb (60.782 kg)  SpO2: 98%      Assessment & Plan:

## 2016-01-24 NOTE — Assessment & Plan Note (Signed)
Counseled her about the need for colon cancer screening since she is now 64. Immunizations up to date. Checking labs. Counseled on sun safety and mole surveillance. Given screening recommendations.

## 2016-02-29 ENCOUNTER — Other Ambulatory Visit (INDEPENDENT_AMBULATORY_CARE_PROVIDER_SITE_OTHER): Payer: 59

## 2016-02-29 DIAGNOSIS — Z0000 Encounter for general adult medical examination without abnormal findings: Secondary | ICD-10-CM | POA: Diagnosis not present

## 2016-02-29 LAB — TSH: TSH: 2.78 u[IU]/mL (ref 0.35–4.50)

## 2016-02-29 LAB — COMPREHENSIVE METABOLIC PANEL
ALK PHOS: 47 U/L (ref 39–117)
ALT: 11 U/L (ref 0–35)
AST: 15 U/L (ref 0–37)
Albumin: 4.3 g/dL (ref 3.5–5.2)
BILIRUBIN TOTAL: 0.8 mg/dL (ref 0.2–1.2)
BUN: 20 mg/dL (ref 6–23)
CALCIUM: 9.2 mg/dL (ref 8.4–10.5)
CO2: 28 mEq/L (ref 19–32)
Chloride: 103 mEq/L (ref 96–112)
Creatinine, Ser: 0.68 mg/dL (ref 0.40–1.20)
GFR: 97.11 mL/min (ref >60.00)
GLUCOSE: 95 mg/dL (ref 70–99)
POTASSIUM: 4 meq/L (ref 3.5–5.1)
Sodium: 138 mEq/L (ref 135–145)
TOTAL PROTEIN: 6.9 g/dL (ref 6.0–8.3)

## 2016-02-29 LAB — LIPID PANEL
CHOL/HDL RATIO: 3
Cholesterol: 232 mg/dL — ABNORMAL HIGH (ref 0–200)
HDL: 80 mg/dL (ref >39.00)
LDL Cholesterol: 133 mg/dL — ABNORMAL HIGH (ref 0–99)
NonHDL: 151.66
TRIGLYCERIDES: 92 mg/dL (ref 0.0–149.0)
VLDL: 18.4 mg/dL (ref 0.0–40.0)

## 2016-02-29 LAB — T4, FREE: FREE T4: 0.62 ng/dL (ref 0.60–1.60)

## 2016-03-01 ENCOUNTER — Encounter: Payer: Self-pay | Admitting: Internal Medicine

## 2016-03-02 ENCOUNTER — Encounter: Payer: Self-pay | Admitting: Internal Medicine

## 2016-03-02 DIAGNOSIS — E039 Hypothyroidism, unspecified: Secondary | ICD-10-CM

## 2016-03-02 MED ORDER — LEVOTHYROXINE SODIUM 50 MCG PO TABS: 50.0000 ug | ORAL_TABLET | Freq: Every day | ORAL | Status: DC

## 2016-03-12 ENCOUNTER — Encounter: Payer: Self-pay | Admitting: Internal Medicine

## 2017-03-05 ENCOUNTER — Encounter: Payer: 59 | Admitting: Internal Medicine

## 2017-03-06 ENCOUNTER — Encounter: Payer: 59 | Admitting: Internal Medicine

## 2017-03-08 ENCOUNTER — Other Ambulatory Visit: Payer: Self-pay | Admitting: Internal Medicine

## 2017-03-12 ENCOUNTER — Encounter: Payer: Self-pay | Admitting: Nurse Practitioner

## 2017-03-12 ENCOUNTER — Other Ambulatory Visit (INDEPENDENT_AMBULATORY_CARE_PROVIDER_SITE_OTHER): Payer: 59

## 2017-03-12 ENCOUNTER — Ambulatory Visit (INDEPENDENT_AMBULATORY_CARE_PROVIDER_SITE_OTHER): Payer: 59 | Admitting: Nurse Practitioner

## 2017-03-12 VITALS — BP 124/82 | HR 70 | Temp 97.9°F | Ht 64.0 in | Wt 140.0 lb

## 2017-03-12 DIAGNOSIS — R5382 Chronic fatigue, unspecified: Secondary | ICD-10-CM | POA: Diagnosis not present

## 2017-03-12 DIAGNOSIS — Z13220 Encounter for screening for lipoid disorders: Secondary | ICD-10-CM

## 2017-03-12 DIAGNOSIS — Z136 Encounter for screening for cardiovascular disorders: Secondary | ICD-10-CM

## 2017-03-12 DIAGNOSIS — Z0001 Encounter for general adult medical examination with abnormal findings: Secondary | ICD-10-CM

## 2017-03-12 DIAGNOSIS — R739 Hyperglycemia, unspecified: Secondary | ICD-10-CM

## 2017-03-12 DIAGNOSIS — E039 Hypothyroidism, unspecified: Secondary | ICD-10-CM

## 2017-03-12 LAB — COMPREHENSIVE METABOLIC PANEL
ALT: 14 U/L (ref 0–35)
AST: 18 U/L (ref 0–37)
Albumin: 4.5 g/dL (ref 3.5–5.2)
Alkaline Phosphatase: 55 U/L (ref 39–117)
BILIRUBIN TOTAL: 1 mg/dL (ref 0.2–1.2)
BUN: 13 mg/dL (ref 6–23)
CO2: 25 meq/L (ref 19–32)
CREATININE: 0.68 mg/dL (ref 0.40–1.20)
Calcium: 9.6 mg/dL (ref 8.4–10.5)
Chloride: 104 mEq/L (ref 96–112)
GFR: 96.71 mL/min (ref >60.00)
GLUCOSE: 108 mg/dL — AB (ref 70–99)
Potassium: 4.5 mEq/L (ref 3.5–5.1)
SODIUM: 135 meq/L (ref 135–145)
Total Protein: 7.1 g/dL (ref 6.0–8.3)

## 2017-03-12 LAB — CBC
HEMATOCRIT: 38.9 % (ref 36.0–46.0)
Hemoglobin: 12.8 g/dL (ref 12.0–15.0)
MCHC: 32.9 g/dL (ref 30.0–36.0)
MCV: 81.4 fl (ref 78.0–100.0)
PLATELETS: 237 10*3/uL (ref 150.0–400.0)
RBC: 4.78 Mil/uL (ref 3.87–5.11)
RDW: 16.3 % — AB (ref 11.5–15.5)
WBC: 3.8 10*3/uL — AB (ref 4.0–10.5)

## 2017-03-12 LAB — LIPID PANEL
CHOL/HDL RATIO: 3
Cholesterol: 233 mg/dL — ABNORMAL HIGH (ref 0–200)
HDL: 73.7 mg/dL (ref >39.00)
LDL Cholesterol: 138 mg/dL — ABNORMAL HIGH (ref 0–99)
NONHDL: 159.48
Triglycerides: 106 mg/dL (ref 0.0–149.0)
VLDL: 21.2 mg/dL (ref 0.0–40.0)

## 2017-03-12 LAB — TSH: TSH: 1.33 u[IU]/mL (ref 0.35–4.50)

## 2017-03-12 LAB — HEMOGLOBIN A1C: Hgb A1c MFr Bld: 6 % (ref 4.6–6.5)

## 2017-03-12 LAB — VITAMIN B12: VITAMIN B 12: 679 pg/mL (ref 211–911)

## 2017-03-12 LAB — T4, FREE: Free T4: 0.68 ng/dL (ref 0.60–1.60)

## 2017-03-12 MED ORDER — SYNTHROID 50 MCG PO TABS: 50.0000 ug | ORAL_TABLET | Freq: Every day | ORAL | 3 refills | Status: DC

## 2017-03-12 MED ORDER — SYNTHROID 25 MCG PO TABS: 25.0000 ug | ORAL_TABLET | Freq: Every day | ORAL | 3 refills | Status: DC

## 2017-03-12 NOTE — Patient Instructions (Signed)
Please go to basement for blood draw. You will be contacted with results.  Will send prescription after review of lab results.  Please schedule mammogram and colonoscopy.  Preventive Care 40-64 Years, Female Preventive care refers to lifestyle choices and visits with your health care provider that can promote health and wellness. What does preventive care include?  A yearly physical exam. This is also called an annual well check.  Dental exams once or twice a year.  Routine eye exams. Ask your health care provider how often you should have your eyes checked.  Personal lifestyle choices, including: ? Daily care of your teeth and gums. ? Regular physical activity. ? Eating a healthy diet. ? Avoiding tobacco and drug use. ? Limiting alcohol use. ? Practicing safe sex. ? Taking low-dose aspirin daily starting at age 22. ? Taking vitamin and mineral supplements as recommended by your health care provider. What happens during an annual well check? The services and screenings done by your health care provider during your annual well check will depend on your age, overall health, lifestyle risk factors, and family history of disease. Counseling Your health care provider may ask you questions about your:  Alcohol use.  Tobacco use.  Drug use.  Emotional well-being.  Home and relationship well-being.  Sexual activity.  Eating habits.  Work and work Statistician.  Method of birth control.  Menstrual cycle.  Pregnancy history.  Screening You may have the following tests or measurements:  Height, weight, and BMI.  Blood pressure.  Lipid and cholesterol levels. These may be checked every 5 years, or more frequently if you are over 43 years old.  Skin check.  Lung cancer screening. You may have this screening every year starting at age 55 if you have a 30-pack-year history of smoking and currently smoke or have quit within the past 15 years.  Fecal occult blood test  (FOBT) of the stool. You may have this test every year starting at age 62.  Flexible sigmoidoscopy or colonoscopy. You may have a sigmoidoscopy every 5 years or a colonoscopy every 10 years starting at age 20.  Hepatitis C blood test.  Hepatitis B blood test.  Sexually transmitted disease (STD) testing.  Diabetes screening. This is done by checking your blood sugar (glucose) after you have not eaten for a while (fasting). You may have this done every 1-3 years.  Mammogram. This may be done every 1-2 years. Talk to your health care provider about when you should start having regular mammograms. This may depend on whether you have a family history of breast cancer.  BRCA-related cancer screening. This may be done if you have a family history of breast, ovarian, tubal, or peritoneal cancers.  Pelvic exam and Pap test. This may be done every 3 years starting at age 55. Starting at age 31, this may be done every 5 years if you have a Pap test in combination with an HPV test.  Bone density scan. This is done to screen for osteoporosis. You may have this scan if you are at high risk for osteoporosis.  Discuss your test results, treatment options, and if necessary, the need for more tests with your health care provider. Vaccines Your health care provider may recommend certain vaccines, such as:  Influenza vaccine. This is recommended every year.  Tetanus, diphtheria, and acellular pertussis (Tdap, Td) vaccine. You may need a Td booster every 10 years.  Varicella vaccine. You may need this if you have not been vaccinated.  Zoster  vaccine. You may need this after age 97.  Measles, mumps, and rubella (MMR) vaccine. You may need at least one dose of MMR if you were born in 1957 or later. You may also need a second dose.  Pneumococcal 13-valent conjugate (PCV13) vaccine. You may need this if you have certain conditions and were not previously vaccinated.  Pneumococcal polysaccharide (PPSV23)  vaccine. You may need one or two doses if you smoke cigarettes or if you have certain conditions.  Meningococcal vaccine. You may need this if you have certain conditions.  Hepatitis A vaccine. You may need this if you have certain conditions or if you travel or work in places where you may be exposed to hepatitis A.  Hepatitis B vaccine. You may need this if you have certain conditions or if you travel or work in places where you may be exposed to hepatitis B.  Haemophilus influenzae type b (Hib) vaccine. You may need this if you have certain conditions.  Talk to your health care provider about which screenings and vaccines you need and how often you need them. This information is not intended to replace advice given to you by your health care provider. Make sure you discuss any questions you have with your health care provider. Document Released: 10/08/2015 Document Revised: 05/31/2016 Document Reviewed: 07/13/2015 Elsevier Interactive Patient Education  2017 [employer].

## 2017-03-12 NOTE — Progress Notes (Signed)
Subjective:  Patient ID: Ariel Tran, female    DOB: 11-16-1965  Age: 51 y.o. MRN: [id number]  CC: Annual Exam (CPE--endo consult?)   HPI  denies any acute complains.  Hypothyroidism: Taking levothyroxine as prescribed (on empty stomach) Constipation and memory fog has improved with medication Complains persistent fatigue, weight gain, and generalized itching.  Will schedule PAP smear with GYN.  Will schedule colonoscopy and mammogram.  Married, home with husband and children (5 adolescents).  Depression screen PHQ 2/9 03/12/2017  Decreased Interest 0  Down, Depressed, Hopeless 0  PHQ - 2 Score 0   Outpatient Medications Prior to Visit  Medication Sig Dispense Refill  . amphetamine-dextroamphetamine (ADDERALL) 15 MG tablet Take 1 tablet by mouth 2 (two) times daily.  0  . levothyroxine (SYNTHROID, LEVOTHROID) 50 MCG tablet TAKE 1 TABLET (50 MCG TOTAL) BY MOUTH DAILY. 30 tablet 0   No facility-administered medications prior to visit.    Family History  Problem Relation Age of Onset  . Cancer Mother        breast and lung  . Hyperlipidemia Mother   . Hypothyroidism Mother   . Thyroid disease Mother   . Cancer Father        prostate  . Diabetes Maternal Uncle   . Thyroid disease Maternal Uncle   . Cancer Paternal Grandmother        ovarian  . Diabetes Paternal Grandmother   . Bleeding Disorder Sister   . Sjogren's syndrome Sister   . Thyroid disease Maternal Aunt   . Thyroid disease Maternal Grandmother   . Thyroid disease Maternal Grandfather    Social History  Substance Use Topics  . Smoking status: Never Smoker  . Smokeless tobacco: Never Used  . Alcohol use 0.0 oz/week   ROS Review of Systems  Constitutional: Positive for malaise/fatigue. Negative for fever and weight loss.  HENT: Negative for congestion and sore throat.   Eyes:       Negative for visual changes  Respiratory: Negative for cough and shortness of breath.   Cardiovascular: Negative  for chest pain, palpitations and leg swelling.  Gastrointestinal: Negative for abdominal pain, blood in stool, constipation, diarrhea, heartburn, nausea and vomiting.  Genitourinary: Negative for dysuria, frequency and urgency.  Musculoskeletal: Negative for falls, joint pain and myalgias.  Skin: Negative for rash.  Neurological: Negative for dizziness, sensory change and headaches.  Endo/Heme/Allergies: Negative for environmental allergies. Does not bruise/bleed easily.  Psychiatric/Behavioral: Negative for depression, memory loss, substance abuse and suicidal ideas. The patient is nervous/anxious.      Objective:  BP 124/82   Pulse 70   Temp 97.9 F (36.6 C)   Ht 5\' 4"  (1.626 m)   Wt 140 lb (63.5 kg)   LMP 03/08/2017   SpO2 99%   BMI 24.03 kg/m   BP Readings from Last 3 Encounters:  03/12/17 124/82  01/24/16 112/64  01/12/15 122/82    Wt Readings from Last 3 Encounters:  03/12/17 140 lb (63.5 kg)  01/24/16 134 lb (60.8 kg)  01/12/15 131 lb 8 oz (59.6 kg)    Physical Exam  Constitutional: She is oriented to person, place, and time. No distress.  HENT:  Right Ear: External ear normal.  Left Ear: External ear normal.  Nose: Nose normal.  Mouth/Throat: No oropharyngeal exudate.  Eyes: No scleral icterus.  Neck: Normal range of motion. Neck supple.  Cardiovascular: Normal rate, regular rhythm and normal heart sounds.   Pulmonary/Chest: Effort normal and breath sounds normal.  No respiratory distress.  Declined breast exam.  Abdominal: Soft. She exhibits no distension.  Genitourinary:  Genitourinary Comments: declined  Musculoskeletal: Normal range of motion. She exhibits no edema.  Lymphadenopathy:    She has no cervical adenopathy.  Neurological: She is alert and oriented to person, place, and time.  Skin: Skin is warm and dry.  Psychiatric: She has a normal mood and affect. Her behavior is normal.    Lab Results  Component Value Date   WBC 3.8 (L) 03/12/2017     HGB 12.8 03/12/2017   HCT 38.9 03/12/2017   PLT 237.0 03/12/2017   GLUCOSE 108 (H) 03/12/2017   CHOL 233 (H) 03/12/2017   TRIG 106.0 03/12/2017   HDL 73.70 03/12/2017   LDLCALC 138 (H) 03/12/2017   ALT 14 03/12/2017   AST 18 03/12/2017   NA 135 03/12/2017   K 4.5 03/12/2017   CL 104 03/12/2017   CREATININE 0.68 03/12/2017   BUN 13 03/12/2017   CO2 25 03/12/2017   TSH 1.33 03/12/2017   HGBA1C 6.0 03/12/2017    US Soft Tissue Head/neck  Result Date: 05/20/2013 *RADIOLOGY REPORT* Clinical Data: Neck swelling and fatigue. THYROID ULTRASOUND Technique: Ultrasound examination of the thyroid gland and adjacent soft tissues was performed. Comparison:  None. Findings: Right thyroid lobe:  Measures 4.0 x 1.2 x 1.4 cm and is homogeneous in echotexture. Left thyroid lobe:  Measures 3.8 x 1.2 x 1.4 cm and is homogeneous in echotexture. Isthmus:  Measures 2 mm. Focal nodules:  None. Lymphadenopathy:  None visualized. IMPRESSION: Normal exam. Original Report Authenticated By: Lorin Picket, M.D.    Assessment & Plan:   Ariel Tran was seen today for annual exam.  Diagnoses and all orders for this visit:  Encounter for preventative adult health care exam with abnormal findings -     CBC; Future -     Comprehensive metabolic panel; Future -     Lipid panel; Future  Hypothyroidism, unspecified type -     TSH; Future -     T4, free; Future -     Discontinue: SYNTHROID 25 MCG tablet; Take 1 tablet (25 mcg total) by mouth daily before breakfast. -     SYNTHROID 50 MCG tablet; Take 1 tablet (50 mcg total) by mouth daily before breakfast.  Hyperglycemia -     Hemoglobin A1c; Future  Chronic fatigue -     B12; Future -     Vitamin D 1,25 dihydroxy; Future  Encounter for lipid screening for cardiovascular disease -     Lipid panel; Future   I have discontinued Ariel Tran's levothyroxine and SYNTHROID. I am also having her start on SYNTHROID. Additionally, I am having her maintain her  amphetamine-dextroamphetamine.  Meds ordered this encounter  Medications  . DISCONTD: SYNTHROID 25 MCG tablet    Sig: Take 1 tablet (25 mcg total) by mouth daily before breakfast.    Dispense:  30 tablet    Refill:  3    Order Specific Question:   Supervising Provider    Answer:   Cassandria Anger [[id number]]  . SYNTHROID 50 MCG tablet    Sig: Take 1 tablet (50 mcg total) by mouth daily before breakfast.    Dispense:  30 tablet    Refill:  3    Order Specific Question:   Supervising Provider    Answer:   Cassandria Anger [[id number]]    Follow-up: Return in about 3 months (around 06/12/2017) for hypothyroidim.  Wilfred Lacy, NP

## 2017-03-13 ENCOUNTER — Encounter: Payer: Self-pay | Admitting: Nurse Practitioner

## 2017-03-15 ENCOUNTER — Encounter: Payer: Self-pay | Admitting: Nurse Practitioner

## 2017-03-15 LAB — VITAMIN D 1,25 DIHYDROXY
VITAMIN D3 1, 25 (OH): 50 pg/mL
Vitamin D 1, 25 (OH)2 Total: 50 pg/mL (ref 18–72)
Vitamin D2 1, 25 (OH)2: <8 pg/mL

## 2017-03-20 ENCOUNTER — Ambulatory Visit (INDEPENDENT_AMBULATORY_CARE_PROVIDER_SITE_OTHER): Payer: 59

## 2017-03-20 DIAGNOSIS — Z111 Encounter for screening for respiratory tuberculosis: Secondary | ICD-10-CM | POA: Diagnosis not present

## 2017-03-22 LAB — TB SKIN TEST
INDURATION: 0 mm
TB SKIN TEST: NEGATIVE

## 2017-05-06 NOTE — Progress Notes (Signed)
I was available to supervise the injection. A. Valaree Fresquez, MD  

## 2017-06-18 ENCOUNTER — Ambulatory Visit (INDEPENDENT_AMBULATORY_CARE_PROVIDER_SITE_OTHER)
Admission: RE | Admit: 2017-06-18 | Discharge: 2017-06-18 | Disposition: A | Discharge status: Home / Self Care | Payer: 59 | Source: Ambulatory Visit | Attending: Nurse Practitioner | Admitting: Nurse Practitioner

## 2017-06-18 ENCOUNTER — Ambulatory Visit (INDEPENDENT_AMBULATORY_CARE_PROVIDER_SITE_OTHER): Payer: 59 | Admitting: Nurse Practitioner

## 2017-06-18 ENCOUNTER — Encounter: Payer: Self-pay | Admitting: Nurse Practitioner

## 2017-06-18 VITALS — BP 146/96 | HR 78 | Temp 97.8°F | Ht 64.0 in | Wt 139.0 lb

## 2017-06-18 DIAGNOSIS — R5381 Other malaise: Secondary | ICD-10-CM

## 2017-06-18 DIAGNOSIS — R0602 Shortness of breath: Secondary | ICD-10-CM

## 2017-06-18 DIAGNOSIS — J209 Acute bronchitis, unspecified: Secondary | ICD-10-CM | POA: Diagnosis not present

## 2017-06-18 DIAGNOSIS — R5383 Other fatigue: Secondary | ICD-10-CM

## 2017-06-18 DIAGNOSIS — R05 Cough: Secondary | ICD-10-CM | POA: Diagnosis not present

## 2017-06-18 DIAGNOSIS — R059 Cough, unspecified: Secondary | ICD-10-CM

## 2017-06-18 MED ORDER — GUAIFENESIN-DM 100-10 MG/5ML PO SYRP: 5.0000 mL | ORAL_SOLUTION | ORAL | 0 refills | Status: DC | PRN

## 2017-06-18 MED ORDER — PREDNISONE 10 MG (21) PO TBPK: ORAL_TABLET | ORAL | 0 refills | Status: DC

## 2017-06-18 MED ORDER — ALBUTEROL SULFATE HFA 108 (90 BASE) MCG/ACT IN AERS: 1.0000 | INHALATION_SPRAY | Freq: Four times a day (QID) | RESPIRATORY_TRACT | 0 refills | Status: DC | PRN

## 2017-06-18 MED ORDER — HYDROCODONE-HOMATROPINE 5-1.5 MG/5ML PO SYRP: 5.0000 mL | ORAL_SOLUTION | Freq: Every evening | ORAL | 0 refills | Status: DC | PRN

## 2017-06-18 NOTE — Progress Notes (Signed)
Subjective:  Patient ID: Ariel Tran, female    DOB: Mar 26, 1966  Age: 51 y.o. 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 The current episode started 1 to 4 weeks ago. The problem has been waxing and waning. There has been no fever. Associated symptoms include congestion, coughing and rhinorrhea. Pertinent negatives include no sore throat or wheezing. Associated symptoms comments: SOB with exertion. She has tried decongestant, antihistamine, acetaminophen and increased fluids for the symptoms. The treatment provided no relief.   Outpatient Medications Prior to Visit  Medication Sig Dispense Refill  . amphetamine-dextroamphetamine (ADDERALL) 15 MG tablet Take 1 tablet by mouth 2 (two) times daily.  0  . SYNTHROID 50 MCG tablet Take 1 tablet (50 mcg total) by mouth daily before breakfast. 30 tablet 3   No facility-administered medications prior to visit.     ROS See HPI  Objective:  BP (!) 146/96   Pulse 78   Temp 97.8 F (36.6 C)   Ht 5\' 4"  (1.626 m)   Wt 139 lb (63 kg)   LMP 05/29/2017 (Approximate)   SpO2 99%   BMI 23.86 kg/m   BP Readings from Last 3 Encounters:  06/18/17 (!) 146/96  03/12/17 124/82  01/24/16 112/64    Wt Readings from Last 3 Encounters:  06/18/17 139 lb (63 kg)  03/12/17 140 lb (63.5 kg)  01/24/16 134 lb (60.8 kg)    Physical Exam  Constitutional: She is oriented to person, place, and time. No distress.  HENT:  Right Ear: Tympanic membrane, external ear and ear canal normal.  Left Ear: Tympanic membrane, external ear and ear canal normal.  Nose: Mucosal edema and rhinorrhea present. Right sinus exhibits no maxillary sinus tenderness and no frontal sinus tenderness. Left sinus exhibits no maxillary sinus tenderness and no frontal sinus tenderness.  Mouth/Throat: Uvula is midline. No trismus in the jaw. Posterior oropharyngeal  erythema present. No oropharyngeal exudate.  Eyes: No scleral icterus.  Neck: Normal range of motion. Neck supple.  Cardiovascular: Normal rate and normal heart sounds.   Pulmonary/Chest: Effort normal and breath sounds normal. She has no wheezes. She has no rales.  Musculoskeletal: She exhibits no edema.  Lymphadenopathy:    She has no cervical adenopathy.  Neurological: She is alert and oriented to person, place, and time.  Vitals reviewed.   Lab Results  Component Value Date   WBC 3.8 (L) 03/12/2017   HGB 12.8 03/12/2017   HCT 38.9 03/12/2017   PLT 237.0 03/12/2017   GLUCOSE 108 (H) 03/12/2017   CHOL 233 (H) 03/12/2017   TRIG 106.0 03/12/2017   HDL 73.70 03/12/2017   LDLCALC 138 (H) 03/12/2017   ALT 14 03/12/2017   AST 18 03/12/2017   NA 135 03/12/2017   K 4.5 03/12/2017   CL 104 03/12/2017   CREATININE 0.68 03/12/2017   BUN 13 03/12/2017   CO2 25 03/12/2017   TSH 1.33 03/12/2017   HGBA1C 6.0 03/12/2017    US Soft Tissue Head/neck  Result Date: 05/20/2013 *RADIOLOGY REPORT* Clinical Data: Neck swelling and fatigue. THYROID ULTRASOUND Technique: Ultrasound examination of the thyroid gland and adjacent soft tissues was performed. Comparison:  None. Findings: Right thyroid lobe:  Measures 4.0 x 1.2 x 1.4 cm and is homogeneous in echotexture. Left thyroid lobe:  Measures 3.8 x 1.2 x 1.4 cm and is homogeneous in echotexture. Isthmus:  Measures 2 mm.  Focal nodules:  None. Lymphadenopathy:  None visualized. IMPRESSION: Normal exam. Original Report Authenticated By: Lorin Picket, M.D.    Assessment & Plan:   Ariel Tran was seen today for cough.  Diagnoses and all orders for this visit:  Acute bronchitis, unspecified organism -     DG Chest 2 View; Future -     albuterol (PROVENTIL HFA;VENTOLIN HFA) 108 (90 Base) MCG/ACT inhaler; Inhale 1-2 puffs into the lungs every 6 (six) hours as needed for wheezing or shortness of breath. -     guaiFENesin-dextromethorphan (ROBITUSSIN  DM) 100-10 MG/5ML syrup; Take 5 mLs by mouth every 4 (four) hours as needed for cough. -     HYDROcodone-homatropine (HYCODAN) 5-1.5 MG/5ML syrup; Take 5 mLs by mouth at bedtime as needed for cough. -     predniSONE (STERAPRED UNI-PAK 21 TAB) 10 MG (21) TBPK tablet; Take by mouth as directed.  SOB (shortness of breath) -     DG Chest 2 View; Future -     albuterol (PROVENTIL HFA;VENTOLIN HFA) 108 (90 Base) MCG/ACT inhaler; Inhale 1-2 puffs into the lungs every 6 (six) hours as needed for wheezing or shortness of breath. -     guaiFENesin-dextromethorphan (ROBITUSSIN DM) 100-10 MG/5ML syrup; Take 5 mLs by mouth every 4 (four) hours as needed for cough. -     HYDROcodone-homatropine (HYCODAN) 5-1.5 MG/5ML syrup; Take 5 mLs by mouth at bedtime as needed for cough. -     predniSONE (STERAPRED UNI-PAK 21 TAB) 10 MG (21) TBPK tablet; Take by mouth as directed.  Malaise and fatigue -     DG Chest 2 View; Future   I am having Ariel Tran start on albuterol, guaiFENesin-dextromethorphan, HYDROcodone-homatropine, and predniSONE. I am also having her maintain her amphetamine-dextroamphetamine and SYNTHROID.  Meds ordered this encounter  Medications  . albuterol (PROVENTIL HFA;VENTOLIN HFA) 108 (90 Base) MCG/ACT inhaler    Sig: Inhale 1-2 puffs into the lungs every 6 (six) hours as needed for wheezing or shortness of breath.    Dispense:  1 Inhaler    Refill:  0    Order Specific Question:   Supervising Provider    Answer:   Cassandria Anger [[id number]]  . guaiFENesin-dextromethorphan (ROBITUSSIN DM) 100-10 MG/5ML syrup    Sig: Take 5 mLs by mouth every 4 (four) hours as needed for cough.    Dispense:  118 mL    Refill:  0    Order Specific Question:   Supervising Provider    Answer:   Cassandria Anger [[id number]]  . HYDROcodone-homatropine (HYCODAN) 5-1.5 MG/5ML syrup    Sig: Take 5 mLs by mouth at bedtime as needed for cough.    Dispense:  120 mL    Refill:  0    Order Specific Question:    Supervising Provider    Answer:   Cassandria Anger [[id number]]  . predniSONE (STERAPRED UNI-PAK 21 TAB) 10 MG (21) TBPK tablet    Sig: Take by mouth as directed.    Dispense:  21 tablet    Refill:  0    Order Specific Question:   Supervising Provider    Answer:   Cassandria Anger [[id number]]    Follow-up: Return if symptoms worsen or fail to improve.  Wilfred Lacy, NP

## 2017-06-18 NOTE — Patient Instructions (Addendum)
Normal CXR, treat for bronchitis. No oral abx needed at this time.  Start medications as prescribed.  Encourage adequate oral hydration.  Acute Bronchitis, Adult Acute bronchitis is when air tubes (bronchi) in the lungs suddenly get swollen. The condition can make it hard to breathe. It can also cause these symptoms:  A cough.  Coughing up clear, yellow, or green mucus.  Wheezing.  Chest congestion.  Shortness of breath.  A fever.  Body aches.  Chills.  A sore throat.  Follow these instructions at home: Medicines  Take over-the-counter and prescription medicines only as told by your doctor.  If you were prescribed an antibiotic medicine, take it as told by your doctor. Do not stop taking the antibiotic even if you start to feel better. General instructions  Rest.  Drink enough fluids to keep your pee (urine) clear or pale yellow.  Avoid smoking and secondhand smoke. If you smoke and you need help quitting, ask your doctor. Quitting will help your lungs heal faster.  Use an inhaler, cool mist vaporizer, or humidifier as told by your doctor.  Keep all follow-up visits as told by your doctor. This is important. How is this prevented? To lower your risk of getting this condition again:  Wash your hands often with soap and water. If you cannot use soap and water, use hand sanitizer.  Avoid contact with people who have cold symptoms.  Try not to touch your hands to your mouth, nose, or eyes.  Make sure to get the flu shot every year.  Contact a doctor if:  Your symptoms do not get better in 2 weeks. Get help right away if:  You cough up blood.  You have chest pain.  You have very bad shortness of breath.  You become dehydrated.  You faint (pass out) or keep feeling like you are going to pass out.  You keep throwing up (vomiting).  You have a very bad headache.  Your fever or chills gets worse. This information is not intended to replace advice  given to you by your health care provider. Make sure you discuss any questions you have with your health care provider. Document Released: 02/28/2008 Document Revised: 04/19/2016 Document Reviewed: 03/01/2016 Elsevier Interactive Patient Education  2017 [employer].

## 2017-07-21 ENCOUNTER — Encounter: Payer: Self-pay | Admitting: Nurse Practitioner

## 2017-07-23 NOTE — Telephone Encounter (Signed)
Please advise 

## 2017-07-26 ENCOUNTER — Encounter: Payer: Self-pay | Admitting: Family Medicine

## 2017-07-26 ENCOUNTER — Ambulatory Visit (INDEPENDENT_AMBULATORY_CARE_PROVIDER_SITE_OTHER): Payer: 59 | Admitting: Family Medicine

## 2017-07-26 VITALS — BP 138/64 | HR 76 | Temp 98.6°F | Ht 64.0 in | Wt 141.0 lb

## 2017-07-26 DIAGNOSIS — J0110 Acute frontal sinusitis, unspecified: Secondary | ICD-10-CM | POA: Diagnosis not present

## 2017-07-26 MED ORDER — AZITHROMYCIN 250 MG PO TABS: ORAL_TABLET | ORAL | 0 refills | Status: DC

## 2017-07-26 NOTE — Assessment & Plan Note (Signed)
Possible that she has had an ongoing infection versus just a reinfection with another viral illness. - Counseled on supportive care - Provided azithromycin - Follow-up as needed

## 2017-07-26 NOTE — Progress Notes (Signed)
Ariel Tran - 51 y.o. female MRN [id number]  Date of birth: 03-23-1966  SUBJECTIVE:  Including CC & ROS.  Chief Complaint  Patient presents with  . Sinus pressure    has been present for one week. She has also been having a lot of headaches.    [name] is a 51 y.o. female that is presenting with sinus congestion and pressure. She has had symptoms ongoing. She denies any fevers or chills. 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 She has not had any ear pain. Has had some production with her cough that has improved. She denies any nausea or vomiting.  Patient was seen on 9/24 and treated with acute bronchitis with steroids, inhaler, and cough syrup.  Review of chest x-ray from September 2018 shows normal lung volumes no active disease.   Review of Systems  Constitutional: Negative for fever.  HENT: Positive for sinus pain and sinus pressure.     HISTORY: Past Medical, Surgical, Social, and Family History Reviewed & Updated per EMR.   Pertinent Historical Findings include:  Past Medical History:  Diagnosis Date  . Allergy   . Frequent headaches   . Heart murmur   . Hypertension   . UTI (urinary tract infection)     Past Surgical History:  Procedure Laterality Date  . CESAREAN SECTION    . TUBAL LIGATION      Allergies  Allergen Reactions  . Chicken Allergy   . Citrus   . Eggs Or Egg-Derived Products     Family History  Problem Relation Age of Onset  . Cancer Mother        breast and lung  . Hyperlipidemia Mother   . Hypothyroidism Mother   . Thyroid disease Mother   . Cancer Father        prostate  . Diabetes Maternal Uncle   . Thyroid disease Maternal Uncle   . Cancer Paternal Grandmother        ovarian  . Diabetes Paternal Grandmother   . Bleeding Disorder Sister   . Sjogren's syndrome Sister   . Thyroid disease Maternal Aunt   . Thyroid disease Maternal Grandmother   . Thyroid disease Maternal Grandfather      Social  History   Social History  . Marital status: Married    Spouse name: N/A  . Number of children: N/A  . Years of education: N/A   Occupational History  . Not on file.   Social History Main Topics  . Smoking status: Never Smoker  . Smokeless tobacco: Never Used  . Alcohol use 0.0 oz/week  . Drug use: Unknown  . Sexual activity: Yes    Birth control/ protection: None, Surgical     Comment: tubal ligation   Other Topics Concern  . Not on file   Social History Narrative  . No narrative on file     PHYSICAL EXAM:  VS: BP 138/64 (BP Location: Left Arm, Patient Position: Sitting, Cuff Size: Normal)   Pulse 76   Temp 98.6 F (37 C) (Oral)   Ht 5\' 4"  (1.626 m)   Wt 141 lb (64 kg)   SpO2 100%   BMI 24.20 kg/m  Physical Exam Gen: NAD, alert, cooperative with exam, well-appearing ENT: normal lips, normal nasal mucosa, tympanic membranes clear and intact bilaterally, normal oropharynx, no cervical lymphadenopathy Eye: normal EOM, normal conjunctiva and lids CV:  no edema, +2 pedal pulses, regular rate and rhythm, S1-S2  Resp: no accessory muscle use, non-labored, clear to auscultation bilaterally, no crackles or wheezes Skin: no rashes, no areas of induration  Neuro: normal tone, normal sensation to touch Psych:  normal insight, alert and oriented MSK: Normal gait, normal strength      ASSESSMENT & PLAN:   Acute non-recurrent frontal sinusitis Possible that she has had an ongoing infection versus just a reinfection with another viral illness. - Counseled on supportive care - Provided azithromycin - Follow-up as needed

## 2017-07-26 NOTE — Patient Instructions (Signed)
Thank you for coming in,   Please take a probiotic with the antibiotic  Please try things such as zyrtec-D or allegra-D which is an antihistamine and decongestant.   Please try afrin which will help with nasal congestion but use for only three days.   Please also try using a netti pot on a regular occasion.  Honey can help with a sore throat.     Please feel free to call with any questions or concerns at any time, at [phone]. --Dr. Austen Wygant  

## 2017-08-06 ENCOUNTER — Encounter: Payer: Self-pay | Admitting: Nurse Practitioner

## 2017-08-06 DIAGNOSIS — E039 Hypothyroidism, unspecified: Secondary | ICD-10-CM

## 2017-08-06 MED ORDER — SYNTHROID 50 MCG PO TABS: 50.0000 ug | ORAL_TABLET | Freq: Every day | ORAL | 0 refills | Status: DC

## 2017-08-06 NOTE — Telephone Encounter (Signed)
Please advise, we sent in 90 days supply in 02/2017. Last ov with [facility] was 01/2016.

## 2017-09-20 ENCOUNTER — Telehealth: Payer: Self-pay | Admitting: Internal Medicine

## 2017-09-20 DIAGNOSIS — E039 Hypothyroidism, unspecified: Secondary | ICD-10-CM

## 2017-09-20 MED ORDER — SYNTHROID 50 MCG PO TABS: 50.0000 ug | ORAL_TABLET | Freq: Every day | ORAL | 0 refills | Status: DC

## 2017-09-20 NOTE — Telephone Encounter (Signed)
Per office policy sent 30 day to local pharmacy until appt.../lmb  

## 2017-09-20 NOTE — Telephone Encounter (Signed)
Pt needs a refill of her SYNTHROID 50 MCG tablet  To get her through to her appt on 1/8 Please advise  POF

## 2017-09-20 NOTE — Addendum Note (Signed)
Addended by: Earnstine Regal on: 09/20/2017 12:44 PM   Modules accepted: Orders

## 2017-10-01 ENCOUNTER — Other Ambulatory Visit: Payer: Self-pay | Admitting: Internal Medicine

## 2017-10-01 ENCOUNTER — Encounter: Payer: Self-pay | Admitting: Internal Medicine

## 2017-10-01 DIAGNOSIS — E039 Hypothyroidism, unspecified: Secondary | ICD-10-CM

## 2017-10-01 MED ORDER — SYNTHROID 50 MCG PO TABS: 50.0000 ug | ORAL_TABLET | Freq: Every day | ORAL | 1 refills | Status: DC

## 2017-10-02 ENCOUNTER — Ambulatory Visit (INDEPENDENT_AMBULATORY_CARE_PROVIDER_SITE_OTHER): Payer: BLUE CROSS/BLUE SHIELD | Admitting: Internal Medicine

## 2017-10-02 ENCOUNTER — Encounter: Payer: Self-pay | Admitting: Internal Medicine

## 2017-10-02 DIAGNOSIS — E039 Hypothyroidism, unspecified: Secondary | ICD-10-CM

## 2017-10-02 MED ORDER — LEVOTHYROXINE SODIUM 75 MCG PO TABS: 75.0000 ug | ORAL_TABLET | Freq: Every day | ORAL | 3 refills | Status: DC

## 2017-10-02 NOTE — Assessment & Plan Note (Signed)
Levels are still very low on synthroid 50 mcg daily. Will increase to 75 mcg daily. Explained to her that thyroid medication does not cause her to gain weight but that low thyroid levels can cause weight gain and difficulty losing weight. Labs ordered for 1 month.

## 2017-10-02 NOTE — Progress Notes (Signed)
   Subjective:    Patient ID: Ariel Tran, female    DOB: 11-28-1965, 52 y.o.   MRN: [id number]  HPI The patient is a 52 YO female coming in for follow up of her thyroid with problems. She thinks that the synthroid is making her gain weight. She changed to the brand name over the summer. She had testing then. She is not sure if the testing is changed. Denies cold or heat intolerance. She feels that the medicine is causing her to gain 20 pounds in the last several years and some breast swelling and tenderness. She is also experiencing perimenopausal symptoms. She denies constipation or diarrhea. She initially wanted to get off the thyroid medication.   Review of Systems  Constitutional: Positive for fatigue and unexpected weight change.  Respiratory: Negative for cough, chest tightness and shortness of breath.   Cardiovascular: Negative for chest pain, palpitations and leg swelling.  Gastrointestinal: Negative for abdominal distention, abdominal pain, constipation, diarrhea, nausea and vomiting.  Musculoskeletal: Negative.   Skin: Negative.   Neurological: Negative.   Psychiatric/Behavioral: Negative.       Objective:   Physical Exam  Constitutional: She is oriented to person, place, and time. She appears well-developed and well-nourished.  HENT:  Head: Normocephalic and atraumatic.  Eyes: EOM are normal.  Neck: Normal range of motion.  Cardiovascular: Normal rate and regular rhythm.  Pulmonary/Chest: Effort normal and breath sounds normal. No respiratory distress. She has no wheezes. She has no rales.  Abdominal: Soft.  Musculoskeletal: She exhibits no edema.  Neurological: She is alert and oriented to person, place, and time. Coordination normal.  Skin: Skin is warm and dry.   Vitals:   10/02/17 0814  BP: 124/80  Pulse: 76  Temp: 98.3 F (36.8 C)  TempSrc: Oral  SpO2: 100%  Weight: 143 lb (64.9 kg)  Height: 5\' 4"  (1.626 m)      Assessment & Plan:

## 2017-10-02 NOTE — Patient Instructions (Signed)
We have sent in the higher dose of the synthroid to take for 1 month then we can recheck the levels.

## 2017-10-30 ENCOUNTER — Encounter: Payer: Self-pay | Admitting: Family Medicine

## 2017-10-30 ENCOUNTER — Ambulatory Visit: Payer: BLUE CROSS/BLUE SHIELD | Admitting: Family Medicine

## 2017-10-30 DIAGNOSIS — R05 Cough: Secondary | ICD-10-CM | POA: Diagnosis not present

## 2017-10-30 DIAGNOSIS — R059 Cough, unspecified: Secondary | ICD-10-CM

## 2017-10-30 NOTE — Progress Notes (Signed)
HAIVYN ORAVEC - 52 y.o. female MRN [id number]  Date of birth: 1966-07-11  SUBJECTIVE:  Including CC & ROS.  Chief Complaint  Patient presents with  . Cough    MARILLYN GOREN is a 52 y.o. female that is presenting with cough, diarrhea and body aches. Ongoing for five days. Admits to fevers and chills.She has been taking Nyquil and Motrin with some improvement. She is a Freight forwarder. She states she felt crackling when she was coughing last night.     Review of Systems  Constitutional: Positive for chills and fever.  Gastrointestinal: Positive for diarrhea.  Musculoskeletal: Negative for back pain.  Hematological: Negative for adenopathy.  Psychiatric/Behavioral: Negative for agitation.    HISTORY: Past Medical, Surgical, Social, and Family History Reviewed & Updated per EMR.   Pertinent Historical Findings include:  Past Medical History:  Diagnosis Date  . Allergy   . Frequent headaches   . Heart murmur   . Hypertension   . UTI (urinary tract infection)     Past Surgical History:  Procedure Laterality Date  . CESAREAN SECTION    . TUBAL LIGATION      Allergies  Allergen Reactions  . Chicken Allergy   . Citrus   . Eggs Or Egg-Derived Products     Family History  Problem Relation Age of Onset  . Cancer Mother        breast and lung  . Hyperlipidemia Mother   . Hypothyroidism Mother   . Thyroid disease Mother   . Cancer Father        prostate  . Diabetes Maternal Uncle   . Thyroid disease Maternal Uncle   . Cancer Paternal Grandmother        ovarian  . Diabetes Paternal Grandmother   . Bleeding Disorder Sister   . Sjogren's syndrome Sister   . Thyroid disease Maternal Aunt   . Thyroid disease Maternal Grandmother   . Thyroid disease Maternal Grandfather      Social History   Socioeconomic History  . Marital status: Married    Spouse name: Not on file  . Number of children: Not on file  . Years of education: Not on file  . Highest education level:  Not on file  Social Needs  . Financial resource strain: Not on file  . Food insecurity - worry: Not on file  . Food insecurity - inability: Not on file  . Transportation needs - medical: Not on file  . Transportation needs - non-medical: Not on file  Occupational History  . Not on file  Tobacco Use  . Smoking status: Never Smoker  . Smokeless tobacco: Never Used  Substance and Sexual Activity  . Alcohol use: Yes    Alcohol/week: 0.0 oz  . Drug use: Not on file  . Sexual activity: Yes    Birth control/protection: None, Surgical    Comment: tubal ligation  Other Topics Concern  . Not on file  Social History Narrative  . Not on file     PHYSICAL EXAM:  VS: BP 140/84 (BP Location: Left Arm, Patient Position: Sitting, Cuff Size: Normal)   Pulse 68   Temp 98.1 F (36.7 C) (Oral)   Ht 5\' 4"  (1.626 m)   Wt 140 lb (63.5 kg)   SpO2 98%   BMI 24.03 kg/m  Physical Exam Gen: NAD, alert, cooperative with exam,  ENT: normal lips, normal nasal mucosa, tympanic membranes clear and intact bilaterally, normal oropharynx, no cervical lymphadenopathy Eye: normal EOM, normal  conjunctiva and lids CV:  no edema, +2 pedal pulses, regular rate and rhythm, S1-S2   Resp: no accessory muscle use, non-labored, clear to auscultation bilaterally, no crackles or wheezes GI: no masses or tenderness, no hernia  Skin: no rashes, no areas of induration  Neuro: normal tone, normal sensation to touch Psych:  normal insight, alert and oriented MSK: Normal gait, normal strength       ASSESSMENT & PLAN:   Cough Doing well now. Likely viral.  - counseled on supportive care - f/u prn

## 2017-11-03 DIAGNOSIS — R059 Cough, unspecified: Secondary | ICD-10-CM | POA: Insufficient documentation

## 2017-11-03 DIAGNOSIS — R05 Cough: Secondary | ICD-10-CM | POA: Insufficient documentation

## 2017-11-03 NOTE — Assessment & Plan Note (Addendum)
Doing well now. Likely viral.  - counseled on supportive care - f/u prn

## 2017-12-15 DIAGNOSIS — J020 Streptococcal pharyngitis: Secondary | ICD-10-CM | POA: Diagnosis not present

## 2017-12-21 ENCOUNTER — Encounter: Payer: Self-pay | Admitting: Internal Medicine

## 2017-12-21 ENCOUNTER — Other Ambulatory Visit: Payer: Self-pay | Admitting: Internal Medicine

## 2017-12-21 DIAGNOSIS — N63 Unspecified lump in unspecified breast: Secondary | ICD-10-CM

## 2017-12-21 DIAGNOSIS — Z1231 Encounter for screening mammogram for malignant neoplasm of breast: Secondary | ICD-10-CM

## 2017-12-24 ENCOUNTER — Other Ambulatory Visit: Payer: Self-pay | Admitting: Internal Medicine

## 2017-12-24 DIAGNOSIS — N63 Unspecified lump in unspecified breast: Secondary | ICD-10-CM

## 2018-01-02 ENCOUNTER — Other Ambulatory Visit: Payer: Self-pay | Admitting: Internal Medicine

## 2018-01-02 ENCOUNTER — Ambulatory Visit
Admission: RE | Admit: 2018-01-02 | Discharge: 2018-01-02 | Disposition: A | Discharge status: Home / Self Care | Payer: BLUE CROSS/BLUE SHIELD | Source: Ambulatory Visit | Attending: Internal Medicine | Admitting: Internal Medicine

## 2018-01-02 DIAGNOSIS — N6489 Other specified disorders of breast: Secondary | ICD-10-CM

## 2018-01-02 DIAGNOSIS — N6322 Unspecified lump in the left breast, upper inner quadrant: Secondary | ICD-10-CM | POA: Diagnosis not present

## 2018-01-02 DIAGNOSIS — N63 Unspecified lump in unspecified breast: Secondary | ICD-10-CM

## 2018-01-02 DIAGNOSIS — R922 Inconclusive mammogram: Secondary | ICD-10-CM | POA: Diagnosis not present

## 2018-01-03 ENCOUNTER — Ambulatory Visit
Admission: RE | Admit: 2018-01-03 | Discharge: 2018-01-03 | Disposition: A | Discharge status: Home / Self Care | Payer: BLUE CROSS/BLUE SHIELD | Source: Ambulatory Visit | Attending: Internal Medicine | Admitting: Internal Medicine

## 2018-01-03 ENCOUNTER — Other Ambulatory Visit: Payer: Self-pay | Admitting: Internal Medicine

## 2018-01-03 DIAGNOSIS — Z1231 Encounter for screening mammogram for malignant neoplasm of breast: Secondary | ICD-10-CM

## 2018-01-03 DIAGNOSIS — N6489 Other specified disorders of breast: Secondary | ICD-10-CM

## 2018-01-03 DIAGNOSIS — N6322 Unspecified lump in the left breast, upper inner quadrant: Secondary | ICD-10-CM | POA: Diagnosis not present

## 2018-01-03 DIAGNOSIS — C50212 Malignant neoplasm of upper-inner quadrant of left female breast: Secondary | ICD-10-CM | POA: Diagnosis not present

## 2018-01-07 ENCOUNTER — Encounter: Payer: Self-pay | Admitting: Internal Medicine

## 2018-01-07 ENCOUNTER — Encounter: Payer: Self-pay | Admitting: *Deleted

## 2018-01-07 ENCOUNTER — Telehealth: Payer: Self-pay | Admitting: Hematology and Oncology

## 2018-01-07 DIAGNOSIS — E039 Hypothyroidism, unspecified: Secondary | ICD-10-CM

## 2018-01-07 NOTE — Telephone Encounter (Signed)
Tried calling both patient and husband to confirm afternoon [facility] appointment for 4/17, both voice mails unable to leave message, e-mail sent instead, packet will be emailed as well

## 2018-01-08 ENCOUNTER — Telehealth: Payer: Self-pay | Admitting: Hematology and Oncology

## 2018-01-08 NOTE — Telephone Encounter (Signed)
Spoke with patient to confirm afternoon [facility] appointment for 4/17, packet emailed to patient

## 2018-01-09 ENCOUNTER — Inpatient Hospital Stay: Payer: BLUE CROSS/BLUE SHIELD | Attending: Hematology and Oncology | Admitting: Hematology and Oncology

## 2018-01-09 ENCOUNTER — Ambulatory Visit: Payer: BLUE CROSS/BLUE SHIELD | Attending: General Surgery | Admitting: Physical Therapy

## 2018-01-09 ENCOUNTER — Encounter: Payer: Self-pay | Admitting: Physical Therapy

## 2018-01-09 ENCOUNTER — Encounter: Payer: Self-pay | Admitting: *Deleted

## 2018-01-09 ENCOUNTER — Encounter: Payer: Self-pay | Admitting: General Practice

## 2018-01-09 ENCOUNTER — Encounter: Payer: Self-pay | Admitting: Hematology and Oncology

## 2018-01-09 ENCOUNTER — Ambulatory Visit
Admission: RE | Admit: 2018-01-09 | Discharge: 2018-01-09 | Disposition: A | Discharge status: Home / Self Care | Payer: BLUE CROSS/BLUE SHIELD | Source: Ambulatory Visit | Attending: Radiation Oncology | Admitting: Radiation Oncology

## 2018-01-09 ENCOUNTER — Other Ambulatory Visit: Payer: Self-pay | Admitting: *Deleted

## 2018-01-09 ENCOUNTER — Other Ambulatory Visit: Payer: Self-pay

## 2018-01-09 ENCOUNTER — Other Ambulatory Visit: Payer: BLUE CROSS/BLUE SHIELD

## 2018-01-09 DIAGNOSIS — C50212 Malignant neoplasm of upper-inner quadrant of left female breast: Secondary | ICD-10-CM | POA: Diagnosis not present

## 2018-01-09 DIAGNOSIS — Z803 Family history of malignant neoplasm of breast: Secondary | ICD-10-CM | POA: Diagnosis not present

## 2018-01-09 DIAGNOSIS — Z9189 Other specified personal risk factors, not elsewhere classified: Secondary | ICD-10-CM

## 2018-01-09 DIAGNOSIS — R293 Abnormal posture: Secondary | ICD-10-CM | POA: Diagnosis not present

## 2018-01-09 DIAGNOSIS — Z006 Encounter for examination for normal comparison and control in clinical research program: Secondary | ICD-10-CM

## 2018-01-09 DIAGNOSIS — Z170 Estrogen receptor positive status [ER+]: Principal | ICD-10-CM

## 2018-01-09 DIAGNOSIS — Z8041 Family history of malignant neoplasm of ovary: Secondary | ICD-10-CM | POA: Diagnosis not present

## 2018-01-09 MED ORDER — LORAZEPAM 1 MG PO TABS: 1.0000 mg | ORAL_TABLET | ORAL | 0 refills | Status: DC | PRN

## 2018-01-09 NOTE — Progress Notes (Signed)
Nutrition Assessment  Reason for Assessment:  Pt seen in Breast Clinic  ASSESSMENT:   52 year old female with new diagnosis of breast cancer.  Past medical history reviewed  Medications:  reviewed  Labs: none taken today  Anthropometrics:   Height: 64 inches Weight: 140 lb BMI: 24   NUTRITION DIAGNOSIS: Food and nutrition related knowledge deficit related to new diagnosis of breast cancer as evidenced by no prior need for nutrition related information.  INTERVENTION:   Discussed and provided packet of information regarding nutritional tips for breast cancer patients.  Questions answered.  Teachback method used.  Contact information provided and patient knows to contact me with questions/concerns.    MONITORING, EVALUATION, and GOAL: Pt will consume a healthy plant based diet to maintain lean body mass throughout treatment.   Ariel Tran B. Zenia Resides, [place], [place] Registered Dietitian [phone] (pager)

## 2018-01-09 NOTE — Progress Notes (Signed)
Radiation Oncology         ([phone]) [phone] ________________________________  Name: Ariel Tran        MRN: [id number]  Date of Service: 01/09/2018 DOB: [birth year]  [id number], Real Cons, MD  Stark Klein, MD     REFERRING PHYSICIAN: Stark Klein, MD   DIAGNOSIS: The encounter diagnosis was Malignant neoplasm of upper-inner quadrant of left breast in female, estrogen receptor positive ([place]).   HISTORY OF PRESENT ILLNESS: Ariel Tran is a 52 y.o. female seen in the multidisciplinary breast clinic for a new diagnosis of left breast cancer. The patient was noted to have a palpable fullness in the right breast but subsequent diagnostic images did not reveal a correlate. She did have a distortion seen in the left breast at 10:30 which measured 2.7 x 2.5 x 1.3 cm, and her axilla was negative by ultrasound. She underwent biopsy of this on 01/03/18 which revealed a grade 1-2 invasive ductal carcinoma, ER/PR positive, HER2 negative with a Ki 67 of 3%. She comes today to discus options of treatment for her cancer.    PREVIOUS RADIATION THERAPY: No   PAST MEDICAL HISTORY:  Past Medical History:  Diagnosis Date  . Allergy   . Frequent headaches   . Heart murmur   . Hypertension   . Hypothyroidism   . UTI (urinary tract infection)        PAST SURGICAL HISTORY: Past Surgical History:  Procedure Laterality Date  . CESAREAN SECTION    . TUBAL LIGATION       FAMILY HISTORY:  Family History  Problem Relation Age of Onset  . Cancer Mother        breast and lung  . Hyperlipidemia Mother   . Hypothyroidism Mother   . Thyroid disease Mother   . Breast cancer Mother   . Cancer Father        prostate  . Diabetes Maternal Uncle   . Thyroid disease Maternal Uncle   . Cancer Paternal Grandmother        ovarian  . Diabetes Paternal Grandmother   . Bleeding Disorder Sister   . Sjogren's syndrome Sister   . Thyroid disease Maternal Aunt   . Thyroid disease Maternal Grandmother     . Thyroid disease Maternal Grandfather      SOCIAL HISTORY:  reports that she has never smoked. She has never used smokeless tobacco. She reports that she drinks alcohol. She reports that she does not use drugs. The patient is married and lives in [place]. 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 She has a young daughter.    ALLERGIES: Levothyroxine sodium; Chicken allergy; Citrus; Contrast media [iodinated diagnostic agents]; and Eggs or egg-derived products   MEDICATIONS:  Current Outpatient Medications  Medication Sig Dispense Refill  . amphetamine-dextroamphetamine (ADDERALL) 15 MG tablet Take 1 tablet by mouth 2 (two) times daily.  0  . levothyroxine (SYNTHROID, LEVOTHROID) 75 MCG tablet Take 1 tablet (75 mcg total) by mouth daily. DAW 30 tablet 3  . LORazepam (ATIVAN) 1 MG tablet Take 1 tablet (1 mg total) by mouth as needed for anxiety. Take one tablet one hour prior to [facility]. Take one tablet at start of exam. 3 tablet 0  . Multiple Vitamins-Minerals (MULTIVITAMIN WITH MINERALS) tablet Take 1 tablet by mouth daily.    . Multiple Vitamins-Minerals (ZINC PO) Take by mouth daily.     No current facility-administered medications for this encounter.  REVIEW OF SYSTEMS: On review of systems, the patient reports that she is doing well overall. She denies any chest pain, shortness of breath, cough, fevers, chills, night sweats, unintended weight changes. She denies any bowel or bladder disturbances, and denies abdominal pain, nausea or vomiting. She denies any new musculoskeletal or joint aches or pains. A complete review of systems is obtained and is otherwise negative.     PHYSICAL EXAM:  Wt Readings from Last 3 Encounters:  01/09/18 140 lb 14.4 oz (63.9 kg)  10/30/17 140 lb (63.5 kg)  10/02/17 143 lb (64.9 kg)   Temp Readings from Last 3 Encounters:  01/09/18 98.6 F (37 C) (Oral)  10/30/17 98.1 F (36.7 C)  (Oral)  10/02/17 98.3 F (36.8 C) (Oral)   BP Readings from Last 3 Encounters:  01/09/18 (!) 119/104  10/30/17 140/84  10/02/17 124/80   Pulse Readings from Last 3 Encounters:  01/09/18 69  10/30/17 68  10/02/17 76     In general this is a well appearing caucasian female in no acute distress. She is alert and oriented x4 and appropriate throughout the examination. HEENT reveals that the patient is normocephalic, atraumatic. EOMs are intact. PERRLA. Skin is intact without any evidence of gross lesions. Cardiovascular exam reveals a regular rate and rhythm, no clicks rubs or murmurs are auscultated. Chest is clear to auscultation bilaterally. Lymphatic assessment is performed and does not reveal any adenopathy in the cervical, supraclavicular, axillary, or inguinal chains. Bilateral breast exam is performed and reveals fullness deep to her left biopsy site, there is a steristrip in place and there is mild ecchymosis noted along the biopsy site. The right breast does not have any palpable mass, and no nipple discharge is noted.  Abdomen has active bowel sounds in all quadrants and is intact. The abdomen is soft, non tender, non distended. Lower extremities are negative for pretibial pitting edema, deep calf tenderness, cyanosis or clubbing.   ECOG = 0  0 - Asymptomatic (Fully active, able to carry on all predisease activities without restriction)  1 - Symptomatic but completely ambulatory (Restricted in physically strenuous activity but ambulatory and able to carry out work of a light or sedentary nature. For example, light housework, office work)  2 - Symptomatic, <50% in bed during the day (Ambulatory and capable of all self care but unable to carry out any work activities. Up and about more than 50% of waking hours)  3 - Symptomatic, >50% in bed, but not bedbound (Capable of only limited self-care, confined to bed or chair 50% or more of waking hours)  4 - Bedbound (Completely disabled.  Cannot carry on any self-care. Totally confined to bed or chair)  5 - Death   Eustace Pen MM, Creech RH, Tormey DC, et al. [phone]). "Toxicity and response criteria of the [facility]". [place] Oncol. 5 (6): 649-55    LABORATORY DATA:  Lab Results  Component Value Date   WBC 3.8 (L) 03/12/2017   HGB 12.8 03/12/2017   HCT 38.9 03/12/2017   MCV 81.4 03/12/2017   PLT 237.0 03/12/2017   Lab Results  Component Value Date   NA 135 03/12/2017   K 4.5 03/12/2017   CL 104 03/12/2017   CO2 25 03/12/2017   Lab Results  Component Value Date   ALT 14 03/12/2017   AST 18 03/12/2017   ALKPHOS 55 03/12/2017   BILITOT 1.0 03/12/2017      RADIOGRAPHY: US Breast Ltd Uni Left Inc  Axilla  Result Date: 01/02/2018 CLINICAL DATA:  Patient complains of a palpable mass in the right breast. EXAM: DIGITAL DIAGNOSTIC BILATERAL MAMMOGRAM WITH CAD AND TOMO ULTRASOUND BILATERAL BREAST COMPARISON:  None. ACR Breast Density Category c: The breast tissue is heterogeneously dense, which may obscure small masses. FINDINGS: Asymmetric fibroglandular tissue is seen in the upper slightly inner quadrant far posteriorly in the right. There is no discrete mass, distortion or malignant type microcalcifications in this area. The remainder of the right breast is negative. In the upper-inner quadrant of the left breast is distortion spanning an area spanning an area of approximately 4.8 cm. There are several punctate masses in the distortion. Mammographic images were processed with CAD. On physical exam, I palpate mild fullness in the 1 o'clock region of the right breast 12 cm from the nipple. I also palpate discrete fullness in the left breast at 10:30 6 cm from the nipple. Targeted ultrasound is performed, showing normal fibroglandular tissue in the right breast at 1 o'clock 12 cm from the nipple. No solid or cystic mass, abnormal shadowing or distortion visualized. Sonographic evaluation of the left breast  shows an irregular hypoechoic mass with distortion measuring 2.7 x 1.3 x 2.5 cm at 10:30 6 cm from the nipple. Sonographic evaluation the left axilla does not show any enlarged adenopathy. IMPRESSION: Suspicious mass and distortion in the 10:30 region of the left breast. RECOMMENDATION: Ultrasound-guided core biopsy of the mass and distortion in the 10:30 region of the left breast is recommended. The biopsy will be scheduled at the patient's convenience. Further evaluation of the palpable abnormality in the right breast with MRI is recommended. I have discussed the findings and recommendations with the patient. Results were also provided in writing at the conclusion of the visit. If applicable, a reminder letter will be sent to the patient regarding the next appointment. BI-RADS CATEGORY  5: Highly suggestive of malignancy. Electronically Signed   By: Lillia Mountain M.D.   On: 01/02/2018 10:23   US Breast Ltd Uni Right Inc Axilla  Result Date: 01/02/2018 CLINICAL DATA:  Patient complains of a palpable mass in the right breast. EXAM: DIGITAL DIAGNOSTIC BILATERAL MAMMOGRAM WITH CAD AND TOMO ULTRASOUND BILATERAL BREAST COMPARISON:  None. ACR Breast Density Category c: The breast tissue is heterogeneously dense, which may obscure small masses. FINDINGS: Asymmetric fibroglandular tissue is seen in the upper slightly inner quadrant far posteriorly in the right. There is no discrete mass, distortion or malignant type microcalcifications in this area. The remainder of the right breast is negative. In the upper-inner quadrant of the left breast is distortion spanning an area spanning an area of approximately 4.8 cm. There are several punctate masses in the distortion. Mammographic images were processed with CAD. On physical exam, I palpate mild fullness in the 1 o'clock region of the right breast 12 cm from the nipple. I also palpate discrete fullness in the left breast at 10:30 6 cm from the nipple. Targeted ultrasound is  performed, showing normal fibroglandular tissue in the right breast at 1 o'clock 12 cm from the nipple. No solid or cystic mass, abnormal shadowing or distortion visualized. Sonographic evaluation of the left breast shows an irregular hypoechoic mass with distortion measuring 2.7 x 1.3 x 2.5 cm at 10:30 6 cm from the nipple. Sonographic evaluation the left axilla does not show any enlarged adenopathy. IMPRESSION: Suspicious mass and distortion in the 10:30 region of the left breast. RECOMMENDATION: Ultrasound-guided core biopsy of the mass and distortion in the  10:30 region of the left breast is recommended. The biopsy will be scheduled at the patient's convenience. Further evaluation of the palpable abnormality in the right breast with MRI is recommended. I have discussed the findings and recommendations with the patient. Results were also provided in writing at the conclusion of the visit. If applicable, a reminder letter will be sent to the patient regarding the next appointment. BI-RADS CATEGORY  5: Highly suggestive of malignancy. Electronically Signed   By: Lillia Mountain M.D.   On: 01/02/2018 10:23   Mm Diag Breast Tomo Bilateral  Result Date: 01/02/2018 CLINICAL DATA:  Patient complains of a palpable mass in the right breast. EXAM: DIGITAL DIAGNOSTIC BILATERAL MAMMOGRAM WITH CAD AND TOMO ULTRASOUND BILATERAL BREAST COMPARISON:  None. ACR Breast Density Category c: The breast tissue is heterogeneously dense, which may obscure small masses. FINDINGS: Asymmetric fibroglandular tissue is seen in the upper slightly inner quadrant far posteriorly in the right. There is no discrete mass, distortion or malignant type microcalcifications in this area. The remainder of the right breast is negative. In the upper-inner quadrant of the left breast is distortion spanning an area spanning an area of approximately 4.8 cm. There are several punctate masses in the distortion. Mammographic images were processed with CAD. On  physical exam, I palpate mild fullness in the 1 o'clock region of the right breast 12 cm from the nipple. I also palpate discrete fullness in the left breast at 10:30 6 cm from the nipple. Targeted ultrasound is performed, showing normal fibroglandular tissue in the right breast at 1 o'clock 12 cm from the nipple. No solid or cystic mass, abnormal shadowing or distortion visualized. Sonographic evaluation of the left breast shows an irregular hypoechoic mass with distortion measuring 2.7 x 1.3 x 2.5 cm at 10:30 6 cm from the nipple. Sonographic evaluation the left axilla does not show any enlarged adenopathy. IMPRESSION: Suspicious mass and distortion in the 10:30 region of the left breast. RECOMMENDATION: Ultrasound-guided core biopsy of the mass and distortion in the 10:30 region of the left breast is recommended. The biopsy will be scheduled at the patient's convenience. Further evaluation of the palpable abnormality in the right breast with MRI is recommended. I have discussed the findings and recommendations with the patient. Results were also provided in writing at the conclusion of the visit. If applicable, a reminder letter will be sent to the patient regarding the next appointment. BI-RADS CATEGORY  5: Highly suggestive of malignancy. Electronically Signed   By: Lillia Mountain M.D.   On: 01/02/2018 10:23   Mm Clip Placement Left  Result Date: 01/03/2018 CLINICAL DATA:  Left breast 10:30 o'clock mass. EXAM: DIAGNOSTIC LEFT MAMMOGRAM POST ULTRASOUND BIOPSY COMPARISON:  Previous exam(s). FINDINGS: Mammographic images were obtained following ultrasound guided biopsy of left breast 10:30 o'clock mass. Two-view mammography demonstrates presence of ribbon shaped marker within the left breast upper inner quadrant, middle depth. The marker is located 1.3 cm posterior and 1.7 cm superior to the geometric center of the distortion seen mammographically. Please note that the marker was placed in the perceived center of  the sonographic abnormality, which is suggestive that this abnormality is much larger than it appears on the mammogram. Therefore, MRI of the breast may be prudent to evaluate the extent of disease. IMPRESSION: Successful placement of ribbon shaped marker post ultrasound-guided core needle biopsy of left breast 10:30 o'clock mass. MRI of the breast may be prudent to evaluate the extent of disease, as the marker is 1.7 cm  away from the geometric center of the distortion seen mammographically, even though it was placed in the perceived center of the sonographic abnormality. Final Assessment: Post Procedure Mammograms for Marker Placement Electronically Signed   By: Fidela Salisbury M.D.   On: 01/03/2018 08:50   [place] Lt Breast Bx W Loc Dev 1st Lesion Img Bx Spec US Guide  Addendum Date: 01/07/2018   ADDENDUM REPORT: 01/07/2018 09:02 ADDENDUM: Pathology revealed GRADE II - INVASIVE DUCTAL CARCINOMA, INTERMEDIATE GRADE - DUCTAL CARCINOMA IN SITU of LEFT breast, 10:30 o'clock. This was found to be concordant by Dr. Fidela Salisbury. Pathology results were discussed with the patient by telephone. The patient reported doing well after the biopsy with tenderness at the site. Post biopsy instructions and care were reviewed and questions were answered. The patient was encouraged to call [facility] for any additional concerns. The patient was referred to [facility] on January 09, 2018. Further evaluation of the palpable abnormality in the RIGHT breast with MRI is recommended per previous diagnostic report. Pathology results reported by Roselind Messier, RN on 01/07/2018. Electronically Signed   By: Fidela Salisbury M.D.   On: 01/07/2018 09:02   Result Date: 01/07/2018 CLINICAL DATA:  Left breast 10:30 o'clock mass EXAM: ULTRASOUND GUIDED LEFT BREAST CORE NEEDLE BIOPSY COMPARISON:  Previous exam(s). FINDINGS: I met with  the patient and we discussed the procedure of ultrasound-guided biopsy, including benefits and alternatives. We discussed the high likelihood of a successful procedure. We discussed the risks of the procedure, including infection, bleeding, tissue injury, clip migration, and inadequate sampling. Informed written consent was given. The usual time-out protocol was performed immediately prior to the procedure. Lesion quadrant: Upper inner quadrant Using sterile technique and 1% Lidocaine as local anesthetic, under direct ultrasound visualization, a 14 gauge spring-loaded device was used to perform biopsy of left breast 10:30 o'clock mass using a lateral approach. At the conclusion of the procedure a ribbon shaped tissue marker clip was deployed into the biopsy cavity. Follow up 2 view mammogram was performed and dictated separately. IMPRESSION: Ultrasound guided biopsy of left breast.  No apparent complications. Electronically Signed: By: Fidela Salisbury M.D. On: 01/03/2018 08:34       IMPRESSION/PLAN: 1. Stage IB, cT2N0M0 grade 1-2, ER/PR positive invasive ductal carcinoma of the left breast. Dr. Lisbeth Renshaw discusses the pathology findings and reviews the nature of invasive breast disease. The consensus from the breast conference includes proceeding with MRI of the breast to determine extent of disease and due to the right sided question initially. She would be an appropriate candidate for breast conservation with lumpectomy with  sentinel mapping. Her tumor will be tested for oncotype dx score to determine a role for systemic therapy. Provided that chemotherapy is not indicated, the patient's course would then be followed by external radiotherapy to the breast followed by antiestrogen therapy. We discussed the risks, benefits, short, and long term effects of radiotherapy, and the patient is interested in proceeding. Dr. Lisbeth Renshaw discusses the delivery and logistics of radiotherapy and anticipates a course of 6 1/2  weeks. We will see her back about 2 weeks after surgery to move forward with the simulation and planning process and anticipate starting radiotherapy about 4 weeks after surgery. 2. Possible genetic predisposition to malignancy. The patient is a candidate for genetic testing given her personal and family history. She was offered referral and is interested in moving forward. She will be  seen in genetics on 01/14/18.   The above documentation reflects my direct findings during this shared patient visit. Please see the separate note by Dr. Lisbeth Renshaw on this date for the remainder of the patient's plan of care.    Carola Rhine, PAC

## 2018-01-09 NOTE — Assessment & Plan Note (Signed)
01/03/2018:Patient felt a lump in the right breast which was normal.  In that evaluation she was noted to have left breast UIQ lesion 2.7 cm at 1030 position, axilla normal, biopsy revealed grade 1-2 IDC ER 100%, PR 100%, Ki-67 3%, HER-2 negative ratio 0.97, T2 N0 stage Ib clinical stage AJCC 8  Pathology and radiology counseling:Discussed with the patient, the details of pathology including the type of breast cancer,the clinical staging, the significance of ER, PR and HER-2/neu receptors and the implications for treatment. After reviewing the pathology in detail, we proceeded to discuss the different treatment options between surgery, radiation, chemotherapy, antiestrogen therapies.  Recommendations: Breast MRI because of high breast density 1. Breast conserving surgery followed by 2. Oncotype DX testing to determine if chemotherapy would be of any benefit followed by 3. Adjuvant radiation therapy followed by 4. Adjuvant antiestrogen therapy  Oncotype counseling: I discussed Oncotype DX test. I explained to the patient that this is a 21 gene panel to evaluate patient tumors DNA to calculate recurrence score. This would help determine whether patient has high risk or intermediate risk or low risk breast cancer. She understands that if her tumor was found to be high risk, she would benefit from systemic chemotherapy. If low risk, no need of chemotherapy. If she was found to be intermediate risk, we would need to evaluate the score as well as other risk factors and determine if an abbreviated chemotherapy may be of benefit.  Return to clinic after surgery to discuss final pathology report and then determine if Oncotype DX testing will need to be sent.

## 2018-01-09 NOTE — Therapy (Signed)
[place], [place], [address] Phone: [phone]   Fax:  [phone]  Physical Therapy Evaluation  Patient Details  Name: Ariel Tran MRN: [id number] Date of Birth: [birth year] Referring Provider: Dr. Stark Klein   Encounter Date: 01/09/2018  PT End of Session - 01/09/18 1809    Visit Number  1    Number of Visits  2    Date for PT Re-Evaluation  03/06/18    PT Start Time  5498    PT Stop Time  1318 Also saw pt from 1347-1357 and 1435-1448 for a total of 35 minutes    PT Time Calculation (min)  12 min    Activity Tolerance  Patient tolerated treatment well    Behavior During Therapy  [facility] for tasks assessed/performed       Past Medical History:  Diagnosis Date  . Allergy   . Frequent headaches   . Heart murmur   . Hypertension   . Hypothyroidism   . UTI (urinary tract infection)     Past Surgical History:  Procedure Laterality Date  . CESAREAN SECTION    . TUBAL LIGATION      There were no vitals filed for this visit.   Subjective Assessment - 01/09/18 1804    Subjective  Patient reports she is here today to be seen by her medical team for her newly diagnosed left breast cancer.    Patient is accompained by:  Family member    Pertinent History  Patient was diagnosed on 01/02/18 with left grade I-II invasive ductal carcinoma breast cancer. It measure 2.7 cm and is located in the upper inner quadrant. It is ER/PR positive and HER2 negative with a Ki67 of 3%.     Patient Stated Goals  Reduce lymphedema risk and learn post op shoulder ROM HEP    Currently in Pain?  No/denies         [facility] PT Assessment - 01/09/18 0001      Assessment   Medical Diagnosis  Left breast cancer    Referring Provider  Dr. Stark Klein    Onset Date/Surgical Date  01/02/18    Hand Dominance  Right    Prior Therapy  none      Precautions   Precautions  Other (comment)    Precaution Comments  active  breast cancer      Restrictions   Weight Bearing Restrictions  No      Balance Screen   Has the patient fallen in the past 6 months  No    Has the patient had a decrease in activity level because of a fear of falling?   No    Is the patient reluctant to leave their home because of a fear of falling?   No      Home Environment   Living Environment  Private residence    Living Arrangements  Spouse/significant other;Children Husband and 19 y.o daughter    Available Help at Discharge  Family      Prior Function   Level of Independence  Independent    Vocation  Full time employment    Vocation Requirements  preschool teacher    Leisure  She walks 2-3x/week for 45 minutes      Cognition   Overall Cognitive Status  Within Functional Limits for tasks assessed      Posture/Postural Control   Posture/Postural Control  Postural limitations    Postural Limitations  Rounded Shoulders;Forward head  ROM / Strength   AROM / PROM / Strength  AROM;Strength      AROM   AROM Assessment Site  Shoulder;Cervical    Right/Left Shoulder  Right;Left    Right Shoulder Extension  36 Degrees    Right Shoulder Flexion  140 Degrees    Right Shoulder ABduction  160 Degrees    Right Shoulder Internal Rotation  70 Degrees    Right Shoulder External Rotation  70 Degrees    Left Shoulder Extension  46 Degrees    Left Shoulder Flexion  139 Degrees    Left Shoulder ABduction  153 Degrees    Left Shoulder Internal Rotation  66 Degrees    Left Shoulder External Rotation  90 Degrees    Cervical Flexion  WNL    Cervical Extension  25% limited    Cervical - Right Side Bend  WNL    Cervical - Left Side Bend  WNL    Cervical - Right Rotation  WNL    Cervical - Left Rotation  WNL      Strength   Overall Strength  Within functional limits for tasks performed        LYMPHEDEMA/ONCOLOGY QUESTIONNAIRE - 01/09/18 1808      Type   Cancer Type  Left breast cancer      Lymphedema Assessments    Lymphedema Assessments  Upper extremities      Right Upper Extremity Lymphedema   10 cm Proximal to Olecranon Process  27.2 cm    Olecranon Process  23.6 cm    10 cm Proximal to Ulnar Styloid Process  22.9 cm    Just Proximal to Ulnar Styloid Process  15.2 cm    Across Hand at [employer]  18.9 cm    At [place] of 2nd Digit  6.5 cm      Left Upper Extremity Lymphedema   10 cm Proximal to Olecranon Process  27.1 cm    Olecranon Process  23.1 cm    10 cm Proximal to Ulnar Styloid Process  21.8 cm    Just Proximal to Ulnar Styloid Process  15.3 cm    Across Hand at [employer]  18 cm    At [place] of 2nd Digit  6.1 cm             Objective measurements completed on examination: See above findings.      Patient was instructed today in a home exercise program today for post op shoulder range of motion. These included active assist shoulder flexion in sitting, scapular retraction, wall walking with shoulder abduction, and hands behind head external rotation.  She was encouraged to do these twice a day, holding 3 seconds and repeating 5 times when permitted by her physician.     PT Education - 01/09/18 1809    Education provided  Yes    Education Details  Lymphedema risk reduction and post op shoulder ROM HEP    Person(s) Educated  Patient;Spouse    Methods  Explanation;Demonstration;Handout    Comprehension  Returned demonstration;Verbalized understanding          PT Long Term Goals - 01/09/18 1813      PT LONG TERM GOAL #1   Title  Patient will demonstrate she has returned to baseline related to shoulder ROM and function post operatively.    Time  8    Period  Weeks    Status  New      Breast Clinic Goals - 01/09/18  1813      Patient will be able to verbalize understanding of pertinent lymphedema risk reduction practices relevant to her diagnosis specifically related to skin care.   Time  1    Period  Days    Status  Achieved      Patient will be able to  return demonstrate and/or verbalize understanding of the post-op home exercise program related to regaining shoulder range of motion.   Time  1    Period  Days    Status  Achieved      Patient will be able to verbalize understanding of the importance of attending the postoperative After Breast Cancer Class for further lymphedema risk reduction education and therapeutic exercise.   Time  1    Period  Days    Status  Achieved            Plan - 01/09/18 1810    Clinical Impression Statement  Patient was diagnosed on 01/02/18 with left grade I-II invasive ductal carcinoma breast cancer. It measure 2.7 cm and is located in the upper inner quadrant. It is ER/PR positive and HER2 negative with a Ki67 of 3%. Her multidisciplinary medical team met prior to her assessments to determine a recommended treatment plan. She is planning to have a left lumpectomy and sentinel node biopsy followed by Oncotype testing, radiation, and anti-estrogen therapy. She will benefit from a post op PT visit to reasses and determine needs.    History and Personal Factors relevant to plan of care:  None    Clinical Presentation  Stable    Clinical Decision Making  Low    Rehab Potential  Excellent    Clinical Impairments Affecting Rehab Potential  None    PT Frequency  -- Eval and 1 f/u visit    PT Treatment/Interventions  ADLs/Self Care Home Management;Therapeutic exercise;Patient/family education    PT Next Visit Plan  Will reassess 3-4 weeks post op    PT Home Exercise Plan  Post op shoulder ROM HEP    Consulted and Agree with Plan of Care  Patient;Family member/caregiver       Patient will benefit from skilled therapeutic intervention in order to improve the following deficits and impairments:  Decreased knowledge of precautions, Impaired UE functional use, Decreased range of motion, Postural dysfunction, Pain  Visit Diagnosis: Malignant neoplasm of upper-inner quadrant of left breast in female, estrogen  receptor positive ([place]) - Plan: PT plan of care cert/re-cert  Abnormal posture - Plan: PT plan of care cert/re-cert   Patient will follow up at outpatient cancer rehab 3-4 weeks following surgery.  If the patient requires physical therapy at that time, a specific plan will be dictated and sent to the referring physician for approval. The patient was educated today on appropriate basic range of motion exercises to begin post operatively and the importance of attending the After Breast Cancer class following surgery.  Patient was educated today on lymphedema risk reduction practices as it pertains to recommendations that will benefit the patient immediately following surgery.  She verbalized good understanding.      Problem List Patient Active Problem List   Diagnosis Date Noted  . Malignant neoplasm of upper-inner quadrant of left breast in female, estrogen receptor positive ([place]) 01/09/2018  . Cough 11/03/2017  . Hypothyroidism 10/02/2017  . Acute non-recurrent frontal sinusitis 07/26/2017  . Routine general medical examination at a health care facility 11/19/2014  . Chronic fatigue 10/26/2014  . Xerostomia 10/26/2014    Myra Gianotti  Tamala Julian, PT 01/09/18 6:16 PM  [place], [place], [address] Phone: [phone]   Fax:  [phone]  Name: Ariel Tran MRN: [id number] Date of Birth: [birth year]

## 2018-01-09 NOTE — Patient Instructions (Signed)

## 2018-01-09 NOTE — Progress Notes (Signed)
[facility] CONSULT NOTE  Patient Care Team: Hoyt Koch, MD as PCP - General (Internal Medicine)  CHIEF COMPLAINTS/PURPOSE OF CONSULTATION:  Newly diagnosed breast cancer  HISTORY OF PRESENTING ILLNESS:  Ariel Tran 52 y.o. female is here because of recent diagnosis of left breast cancer.  Abnormality in the right breast which led to further evaluation.  Done so that she had a left breast mass.  It was in the upper inner quadrant measuring 2.7 cm by ultrasound at 1030 position.  Axilla was normal.  Biopsy of the mass revealed grade 1-2 invasive ductal carcinoma that was ER PR positive HER-2 negative with a Ki-67 of 3%.  She was presented to the multidisciplinary tumor board and she is here today at the [facility] clinic to discuss the treatment plan.  I reviewed her records extensively and collaborated the history with the patient.  SUMMARY OF ONCOLOGIC HISTORY:   Malignant neoplasm of upper-inner quadrant of left breast in female, estrogen receptor positive ([place])   01/03/2018 Initial Diagnosis    Patient felt a lump in the right breast which was normal.  In that evaluation she was noted to have left breast UIQ lesion 2.7 cm at 1030 position, axilla normal, biopsy revealed grade 1-2 IDC ER 100%, PR 100%, Ki-67 3%, HER-2 negative ratio 0.97, T2 N0 stage Ib clinical stage AJCC 8      MEDICAL HISTORY:  Past Medical History:  Diagnosis Date  . Allergy   . Frequent headaches   . Heart murmur   . Hypertension   . Hypothyroidism   . UTI (urinary tract infection)     SURGICAL HISTORY: Past Surgical History:  Procedure Laterality Date  . CESAREAN SECTION    . TUBAL LIGATION      SOCIAL HISTORY: Social History   Socioeconomic History  . Marital status: Married    Spouse name: Not on file  . Number of children: Not on file  . Years of education: Not on file  . Highest education level: Not on file  Occupational History  . Not on file  Social Needs  .  Financial resource strain: Not on file  . Food insecurity:    Worry: Not on file    Inability: Not on file  . Transportation needs:    Medical: Not on file    Non-medical: Not on file  Tobacco Use  . Smoking status: Never Smoker  . Smokeless tobacco: Never Used  Substance and Sexual Activity  . Alcohol use: Yes    Alcohol/week: 0.0 oz  . Drug use: Never  . Sexual activity: Yes    Birth control/protection: None, Surgical    Comment: tubal ligation  Lifestyle  . Physical activity:    Days per week: Not on file    Minutes per session: Not on file  . Stress: Not on file  Relationships  . Social connections:    Talks on phone: Not on file    Gets together: Not on file    Attends religious service: Not on file    Active member of club or organization: Not on file    Attends meetings of clubs or organizations: Not on file    Relationship status: Not on file  . Intimate partner violence:    Fear of current or ex partner: Not on file    Emotionally abused: Not on file    Physically abused: Not on file    Forced sexual activity: Not on file  Other Topics Concern  .  Not on file  Social History Narrative  . Not on file    FAMILY HISTORY: Family History  Problem Relation Age of Onset  . Cancer Mother        breast and lung  . Hyperlipidemia Mother   . Hypothyroidism Mother   . Thyroid disease Mother   . Breast cancer Mother   . Cancer Father        prostate  . Diabetes Maternal Uncle   . Thyroid disease Maternal Uncle   . Cancer Paternal Grandmother        ovarian  . Diabetes Paternal Grandmother   . Bleeding Disorder Sister   . Sjogren's syndrome Sister   . Thyroid disease Maternal Aunt   . Thyroid disease Maternal Grandmother   . Thyroid disease Maternal Grandfather     ALLERGIES:  is allergic to levothyroxine sodium; chicken allergy; citrus; contrast media [iodinated diagnostic agents]; and eggs or egg-derived products.  MEDICATIONS:  Current Outpatient  Medications  Medication Sig Dispense Refill  . amphetamine-dextroamphetamine (ADDERALL) 15 MG tablet Take 1 tablet by mouth 2 (two) times daily.  0  . levothyroxine (SYNTHROID, LEVOTHROID) 75 MCG tablet Take 1 tablet (75 mcg total) by mouth daily. DAW 30 tablet 3  . Multiple Vitamins-Minerals (MULTIVITAMIN WITH MINERALS) tablet Take 1 tablet by mouth daily.    . Multiple Vitamins-Minerals (ZINC PO) Take by mouth daily.    Marland Kitchen LORazepam (ATIVAN) 1 MG tablet Take 1 tablet (1 mg total) by mouth as needed for anxiety. Take one tablet one hour prior to [facility]. Take one tablet at start of exam. 3 tablet 0   No current facility-administered medications for this visit.     REVIEW OF SYSTEMS:   Constitutional: Denies fevers, chills or abnormal night sweats Eyes: Denies blurriness of vision, double vision or watery eyes Ears, nose, mouth, throat, and face: Denies mucositis or sore throat Respiratory: Denies cough, dyspnea or wheezes Cardiovascular: Denies palpitation, chest discomfort or lower extremity swelling Gastrointestinal:  Denies nausea, heartburn or change in bowel habits Skin: Denies abnormal skin rashes Lymphatics: Denies new lymphadenopathy or easy bruising Neurological:Denies numbness, tingling or new weaknesses Behavioral/Psych: Mood is stable, no new changes  Breast:  Denies any palpable lumps or discharge All other systems were reviewed with the patient and are negative.  PHYSICAL EXAMINATION: ECOG PERFORMANCE STATUS: 1 - Symptomatic but completely ambulatory  Vitals:   01/09/18 1300  BP: (!) 119/104  Pulse: 69  Resp: 19  Temp: 98.6 F (37 C)  SpO2: 100%   Filed Weights   01/09/18 1300  Weight: 140 lb 14.4 oz (63.9 kg)    GENERAL:alert, no distress and comfortable SKIN: skin color, texture, turgor are normal, no rashes or significant lesions EYES: normal, conjunctiva are pink and non-injected, sclera clear OROPHARYNX:no exudate, no erythema and lips, buccal mucosa, and  tongue normal  NECK: supple, thyroid normal size, non-tender, without nodularity LYMPH:  no palpable lymphadenopathy in the cervical, axillary or inguinal LUNGS: clear to auscultation and percussion with normal breathing effort HEART: regular rate & rhythm and no murmurs and no lower extremity edema ABDOMEN:abdomen soft, non-tender and normal bowel sounds Musculoskeletal:no cyanosis of digits and no clubbing  PSYCH: alert & oriented x 3 with fluent speech NEURO: no focal motor/sensory deficits BREAST: No palpable nodules in breast. No palpable axillary or supraclavicular lymphadenopathy (exam performed in the presence of a chaperone)   LABORATORY DATA:  I have reviewed the data as listed Lab Results  Component Value Date  WBC 3.8 (L) 03/12/2017   HGB 12.8 03/12/2017   HCT 38.9 03/12/2017   MCV 81.4 03/12/2017   PLT 237.0 03/12/2017   Lab Results  Component Value Date   NA 135 03/12/2017   K 4.5 03/12/2017   CL 104 03/12/2017   CO2 25 03/12/2017    RADIOGRAPHIC STUDIES: I have personally reviewed the radiological reports and agreed with the findings in the report.  ASSESSMENT AND PLAN:  Malignant neoplasm of upper-inner quadrant of left breast in female, estrogen receptor positive ([place]) 01/03/2018:Patient felt a lump in the right breast which was normal.  In that evaluation she was noted to have left breast UIQ lesion 2.7 cm at 1030 position, axilla normal, biopsy revealed grade 1-2 IDC ER 100%, PR 100%, Ki-67 3%, HER-2 negative ratio 0.97, T2 N0 stage Ib clinical stage AJCC 8  Pathology and radiology counseling:Discussed with the patient, the details of pathology including the type of breast cancer,the clinical staging, the significance of ER, PR and HER-2/neu receptors and the implications for treatment. After reviewing the pathology in detail, we proceeded to discuss the different treatment options between surgery, radiation, chemotherapy, antiestrogen  therapies.  Recommendations: Breast MRI because of high breast density 1. Breast conserving surgery followed by 2. Oncotype DX testing to determine if chemotherapy would be of any benefit followed by 3. Adjuvant radiation therapy followed by 4. Adjuvant antiestrogen therapy  Oncotype counseling: I discussed Oncotype DX test. I explained to the patient that this is a 21 gene panel to evaluate patient tumors DNA to calculate recurrence score. This would help determine whether patient has high risk or intermediate risk or low risk breast cancer. She understands that if her tumor was found to be high risk, she would benefit from systemic chemotherapy. If low risk, no need of chemotherapy. If she was found to be intermediate risk, we would need to evaluate the score as well as other risk factors and determine if an abbreviated chemotherapy may be of benefit.  Return to clinic after surgery to discuss final pathology report and then determine if Oncotype DX testing will need to be sent.     All questions were answered. The patient knows to call the clinic with any problems, questions or concerns.    Harriette Ohara, MD 01/09/18

## 2018-01-10 ENCOUNTER — Other Ambulatory Visit: Payer: BLUE CROSS/BLUE SHIELD

## 2018-01-10 ENCOUNTER — Encounter: Payer: BLUE CROSS/BLUE SHIELD | Admitting: Genetics

## 2018-01-10 NOTE — Progress Notes (Signed)
[facility] Psychosocial Distress Screening Spiritual Care  Met with Ariel Tran and husband Ariel Tran in [facility] to introduce [facility] team/resources, reviewing distress screen per protocol.  The patient scored a 10 on the Psychosocial Distress Thermometer which indicates severe distress. Also assessed for distress and other psychosocial needs.   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 Two older sons (one of whom has pharmacy pinning 4/18, an exciting family event) live outside the home. Couple has an 53yo dtr at home.  Per pt, BMDC helped decrease distress notably, but she is nervous that the "chest colds" she has had all year may be related to dx.   Provided empathic listening, emotional support, normalization of feelings, and encouragement to explore [place].   Follow up needed: Yes.  Plan to f/u by phone for further support, but please also page if needs arise or circumstances change. Thank you.   [place], [place], [facility] Pager [phone] Voicemail [phone]

## 2018-01-14 ENCOUNTER — Encounter: Payer: Self-pay | Admitting: Genetics

## 2018-01-14 ENCOUNTER — Inpatient Hospital Stay (HOSPITAL_BASED_OUTPATIENT_CLINIC_OR_DEPARTMENT_OTHER): Payer: BLUE CROSS/BLUE SHIELD | Admitting: Genetics

## 2018-01-14 ENCOUNTER — Inpatient Hospital Stay: Payer: BLUE CROSS/BLUE SHIELD

## 2018-01-14 DIAGNOSIS — Z8042 Family history of malignant neoplasm of prostate: Secondary | ICD-10-CM

## 2018-01-14 DIAGNOSIS — C50212 Malignant neoplasm of upper-inner quadrant of left female breast: Secondary | ICD-10-CM

## 2018-01-14 DIAGNOSIS — F901 Attention-deficit hyperactivity disorder, predominantly hyperactive type: Secondary | ICD-10-CM | POA: Diagnosis not present

## 2018-01-14 DIAGNOSIS — Z170 Estrogen receptor positive status [ER+]: Secondary | ICD-10-CM

## 2018-01-14 DIAGNOSIS — Z006 Encounter for examination for normal comparison and control in clinical research program: Secondary | ICD-10-CM

## 2018-01-14 DIAGNOSIS — Z808 Family history of malignant neoplasm of other organs or systems: Secondary | ICD-10-CM

## 2018-01-14 DIAGNOSIS — Z803 Family history of malignant neoplasm of breast: Secondary | ICD-10-CM

## 2018-01-14 LAB — CBC WITH DIFFERENTIAL (CANCER CENTER ONLY)
BASOS PCT: 1 %
Basophils Absolute: 0 10*3/uL (ref 0.0–0.1)
Eosinophils Absolute: 0.7 10*3/uL — ABNORMAL HIGH (ref 0.0–0.5)
Eosinophils Relative: 12 %
HEMATOCRIT: 40.7 % (ref 34.8–46.6)
HEMOGLOBIN: 13.7 g/dL (ref 11.6–15.9)
LYMPHS ABS: 1.1 10*3/uL (ref 0.9–3.3)
LYMPHS PCT: 18 %
MCH: 29 pg (ref 25.1–34.0)
MCHC: 33.6 g/dL (ref 31.5–36.0)
MCV: 86.3 fL (ref 79.5–101.0)
MONO ABS: 0.6 10*3/uL (ref 0.1–0.9)
MONOS PCT: 10 %
NEUTROS ABS: 3.5 10*3/uL (ref 1.5–6.5)
NEUTROS PCT: 59 %
Platelet Count: 228 10*3/uL (ref 145–400)
RBC: 4.72 MIL/uL (ref 3.70–5.45)
RDW: 15.3 % — AB (ref 11.2–14.5)
WBC Count: 5.9 10*3/uL (ref 3.9–10.3)

## 2018-01-14 LAB — CMP (CANCER CENTER ONLY)
ALBUMIN: 4.4 g/dL (ref 3.5–5.0)
ALK PHOS: 78 U/L (ref 40–150)
ALT: 26 U/L (ref 0–55)
ANION GAP: 10 (ref 3–11)
AST: 26 U/L (ref 5–34)
BUN: 14 mg/dL (ref 7–26)
CHLORIDE: 104 mmol/L (ref 98–109)
CO2: 23 mmol/L (ref 22–29)
Calcium: 9.7 mg/dL (ref 8.4–10.4)
Creatinine: 0.79 mg/dL (ref 0.60–1.10)
GFR, Estimated: >60 mL/min (ref >60)
GLUCOSE: 99 mg/dL (ref 70–140)
POTASSIUM: 4.4 mmol/L (ref 3.5–5.1)
SODIUM: 137 mmol/L (ref 136–145)
Total Bilirubin: 0.6 mg/dL (ref 0.2–1.2)
Total Protein: 7.7 g/dL (ref 6.4–8.3)

## 2018-01-14 LAB — RESEARCH LABS

## 2018-01-14 NOTE — Progress Notes (Signed)
REFERRING PROVIDER: Hoyt Koch, MD [place], [place] [id number]  PRIMARY PROVIDER:  Hoyt Koch, MD  PRIMARY REASON FOR VISIT:  1. Malignant neoplasm of upper-inner quadrant of left breast in female, estrogen receptor positive ([place])   2. Family history of breast cancer   3. Family history of prostate cancer   4. Family history of melanoma     HISTORY OF PRESENT ILLNESS:   Ariel Tran, a 52 y.o. female, was seen for a [facility] cancer genetics consultation at the request of Dr. Sharlet Salina due to a personal and family history of cancer.  Ariel Tran presents to clinic today to discuss the possibility of a hereditary predisposition to cancer, genetic testing, and to further clarify her future cancer risks, as well as potential cancer risks for family members.   On 01/03/2018 , at the age of 12, Ariel Tran was diagnosed with invasive ductal carcinoma ER/PR+, HER2 neg  of the left breast. She is having an MRI on Thursday, and is currently planning to have breast conservation surgery followed by adjuvant radiation and antiestrogen therapy.    CANCER HISTORY:    Malignant neoplasm of upper-inner quadrant of left breast in female, estrogen receptor positive ([place])   01/03/2018 Initial Diagnosis    Patient felt a lump in the right breast which was normal.  In that evaluation she was noted to have left breast UIQ lesion 2.7 cm at 1030 position, axilla normal, biopsy revealed grade 1-2 IDC ER 100%, PR 100%, Ki-67 3%, HER-2 negative ratio 0.97, T2 N0 stage Ib clinical stage AJCC 8        HORMONAL RISK FACTORS:  Ovaries intact: yes.  Hysterectomy: no.   Past Medical History:  Diagnosis Date  . Allergy   . Breast cancer ([place])   . Family history of breast cancer   . Family history of melanoma   . Family history of prostate cancer   . Frequent headaches   . Heart murmur   . Hypertension   . Hypothyroidism   . UTI (urinary tract infection)     Past Surgical  History:  Procedure Laterality Date  . CESAREAN SECTION    . TUBAL LIGATION      Social History   Socioeconomic History  . Marital status: Married    Spouse name: Not on file  . Number of children: Not on file  . Years of education: Not on file  . Highest education level: Not on file  Occupational History  . Not on file  Social Needs  . Financial resource strain: Not on file  . Food insecurity:    Worry: Not on file    Inability: Not on file  . Transportation needs:    Medical: Not on file    Non-medical: Not on file  Tobacco Use  . Smoking status: Never Smoker  . Smokeless tobacco: Never Used  Substance and Sexual Activity  . Alcohol use: Yes    Alcohol/week: 0.0 oz  . Drug use: Never  . Sexual activity: Yes    Birth control/protection: None, Surgical    Comment: tubal ligation  Lifestyle  . Physical activity:    Days per week: Not on file    Minutes per session: Not on file  . Stress: Not on file  Relationships  . Social connections:    Talks on phone: Not on file    Gets together: Not on file    Attends religious service: Not on file  Active member of club or organization: Not on file    Attends meetings of clubs or organizations: Not on file    Relationship status: Not on file  Other Topics Concern  . Not on file  Social History Narrative  . Not on file     FAMILY HISTORY:  We obtained a detailed, 4-generation family history.  Significant diagnoses are listed below: Family History  Problem Relation Age of Onset  . Cancer Mother        lung  . Hyperlipidemia Mother   . Hypothyroidism Mother   . Thyroid disease Mother   . Breast cancer Mother        dx late 83's early 60's  . Prostate cancer Father 36       metastatic  . Diabetes Maternal Uncle   . Thyroid disease Maternal Uncle   . Cancer Paternal Grandmother        abdominal area  . Diabetes Paternal Grandmother   . Bleeding Disorder Sister   . Sjogren's syndrome Sister   . Thyroid  disease Maternal Aunt   . Thyroid disease Maternal Grandmother   . Thyroid disease Maternal Grandfather   . Brain cancer Paternal Aunt 23  . Cancer Paternal Grandfather 79       metastatic to bone, thinks primary may have been prostate  . Melanoma Cousin   . Thyroid cancer Cousin        had thyroid disease prior  . Lung cancer Cousin    Ariel Tran has 5 children.  2 are adopted and 3 are biological.  Her biological daughter is 60 and her 2 biological sons are in their 20's.  Ms. Creedon has 2 sisters and a brother with no history of cancer.    Ms. Gural father: died at 8 due to metastatic prostate cancer dx at 7.  Paternal aunts/Uncles: 3 paternal uncles, 2 paternal aunts.  One aunt died of brain cancer in her 3's.  Paternal cousins: no known history of cancer, limited contact with this side of the family.  One of her father's cousins died of cancer at a young age- type of cancer unk.  Paternal grandfather: died of cancer that spread to bone- believes it started in his prostate.  Paternal grandmother:died of abdominal cancer- maybe ov?? In her 79's.  She lived a few years with this cancer.   Ms. Chambers mother: alive, currently being treated for lung cancer.  She has a history of smoking.  She had a history of breast cancer that was diagnosed in her late 70's early 75's. She had a hysterectomy.  Maternal Aunts/Uncles: 1 maternal aunt/1 maternal uncle with no history of cancer.  Maternal cousins: Maternal uncles chilren: 1 daughter had thyroid disease and then developed thyroid cancer, 1 daughter was just diagnosed with melanoma at 36, 1 daughter had lung cancer.  Maternal grandfather: heart disease, no history of cancer.  Maternal grandmother:heart disease, no history of cancer.   Ms. Mothershead is unaware of previous family history of genetic testing for hereditary cancer risks. Patient's maternal ancestors are of [place] descent, and paternal ancestors are of German/English/Native American  descent. There is no reported Ashkenazi Jewish ancestry- unk. There is no known consanguinity.  GENETIC COUNSELING ASSESSMENT: Ariel Tran is a 52 y.o. female with a personal and family history  which is somewhat suggestive of a Hereditary Cancer Predisposition Syndrome. We, therefore, discussed and recommended the following at today's visit.   DISCUSSION: We reviewed the characteristics, features and inheritance patterns  of hereditary cancer syndromes. We also discussed genetic testing, including the appropriate family members to test, the process of testing, insurance coverage and turn-around-time for results. We discussed the implications of a negative, positive and/or variant of uncertain significant result. We recommended Ariel Tran pursue genetic testing for the Common Hereditary Cancers gene panel + Melanoma Panel.   The Common Hereditary Cancer Panel offered by [facility] includes sequencing and/or deletion duplication testing of the following 47 genes: APC, ATM, AXIN2, BARD1, BMPR1A, BRCA1, BRCA2, BRIP1, CDH1, CDKN2A (p14ARF), CDKN2A (p16INK4a), CKD4, CHEK2, CTNNA1, DICER1, EPCAM (Deletion/duplication testing only), GREM1 (promoter region deletion/duplication testing only), KIT, MEN1, MLH1, MSH2, MSH3, MSH6, MUTYH, NBN, NF1, NHTL1, PALB2, PDGFRA, PMS2, POLD1, POLE, PTEN, RAD50, RAD51C, RAD51D, SDHB, SDHC, SDHD, SMAD4, SMARCA4. STK11, TP53, TSC1, TSC2, and VHL.  The following genes were evaluated for sequence changes only: SDHA and HOXB13 c.251G>A variant only.  The Melanoma panel offered by [facility] includes sequencing and/or deletion duplication testing of the following 12 genes: BAP1, BRCA1, BRCA2, BRIP1, CDK4, CDKN2A (p14ARF), CDKN2A (p16INK4a), MC1R, POT1, PTEN, RB1, TERT, and TP53.  The following gene was evaluated for sequence changes only: MITF (c.952G>A, p.GLU318Lys variant only).   We discussed that only 5-10% of cancers are associated with a Hereditary cancer predisposition syndrome.  One of  the most common hereditary cancer syndromes that increases breast cancer risk is called Hereditary Breast and Ovarian Cancer (HBOC) syndrome.  This syndrome is caused by mutations in the BRCA1 and BRCA2 genes.  This syndrome increases an individual's lifetime risk to develop breast, ovarian, pancreatic, and other types of cancer.  There are also many other cancer predisposition syndromes caused by mutations in several other genes.  We discussed that if she is found to have a mutation in one of these genes, it may impact surgical decisions, and alter future medical management recommendations such as increased cancer screenings and consideration of risk reducing surgeries.  A positive result could also have implications for the patient's family members.  A Negative result would mean we were unable to identify a hereditary component to her cancer, but does not rule out the possibility of a hereditary basis for her cancer.  There could be mutations that are undetectable by current technology, or in genes not yet tested or identified to increase cancer risk.    We discussed the potential to find a Variant of Uncertain Significance or VUS.  These are variants that have not yet been identified as pathogenic or benign, and it is unknown if this variant is associated with increased cancer risk or if this is a normal finding.  Most VUS's are reclassified to benign or likely benign.   It should not be used to make medical management decisions. With time, we suspect the lab will determine the significance of any VUS's identified if any.   Based on Ariel Tran's personal and family history of cancer, she meets medical criteria for genetic testing. Despite that she meets criteria, she may still have an out of pocket cost. We discussed that if her out of pocket cost for testing is over $100, the laboratory will call and confirm whether she wants to proceed with testing.  If the out of pocket cost of testing is less than $100  she will be billed by the genetic testing laboratory.   PLAN: After considering the risks, benefits, and limitations, Ariel Tran  provided informed consent to pursue genetic testing and the blood sample was sent to [facility] for analysis of the Common Heredtiary  Cancers Panel + Melanoma Panel. Results should be available within approximately 2-3 weeks' time, at which point they will be disclosed by telephone to Ariel Tran, as will any additional recommendations warranted by these results. Ariel Tran will receive a summary of her genetic counseling visit and a copy of her results once available. This information will also be available in Epic. We encouraged Ariel Tran to remain in contact with cancer genetics annually so that we can continuously update the family history and inform her of any changes in cancer genetics and testing that may be of benefit for her family. Ariel Tran questions were answered to her satisfaction today. Our contact information was provided should additional questions or concerns arise.  Based on Ariel Tran's family history, we recommended her siblings and paternal relatives also, have genetic counseling and testing. Ariel Tran will let us know if we can be of any assistance in coordinating genetic counseling and/or testing for this family member.   Lastly, we encouraged Ariel Tran to remain in contact with cancer genetics annually so that we can continuously update the family history and inform her of any changes in cancer genetics and testing that may be of benefit for this family.   Ms.  Tran questions were answered to her satisfaction today. Our contact information was provided should additional questions or concerns arise. Thank you for the referral and allowing [place] to share in the care of your patient.   Tana Felts, MS, [facility] Certified Genetic Counselor [email or web address] phone: [phone]  The patient was seen for a total of 35 minutes in face-to-face  genetic counseling. This patient was discussed with Drs. Magrinat, Lindi Adie and/or Burr Medico who agrees with the above.

## 2018-01-15 ENCOUNTER — Telehealth: Payer: Self-pay | Admitting: *Deleted

## 2018-01-15 NOTE — Telephone Encounter (Signed)
  Oncology Nurse Navigator Documentation  Navigator Location: [facility] (01/15/18 1400)   )Navigator Encounter Type: Telephone;[facility] Follow-up (01/15/18 1400) Telephone: Outgoing Call;Clinic/[facility] Follow-up (01/15/18 1400)                                                  Time Spent with Patient: 15 (01/15/18 1400)

## 2018-01-17 ENCOUNTER — Ambulatory Visit
Admission: RE | Admit: 2018-01-17 | Discharge: 2018-01-17 | Disposition: A | Discharge status: Home / Self Care | Payer: BLUE CROSS/BLUE SHIELD | Source: Ambulatory Visit | Attending: General Surgery | Admitting: General Surgery

## 2018-01-17 DIAGNOSIS — Z170 Estrogen receptor positive status [ER+]: Principal | ICD-10-CM

## 2018-01-17 DIAGNOSIS — C50212 Malignant neoplasm of upper-inner quadrant of left female breast: Secondary | ICD-10-CM

## 2018-01-17 DIAGNOSIS — C50912 Malignant neoplasm of unspecified site of left female breast: Secondary | ICD-10-CM | POA: Diagnosis not present

## 2018-01-17 MED ORDER — GADOBENATE DIMEGLUMINE 529 MG/ML IV SOLN
14.0000 mL | Freq: Once | INTRAVENOUS | Status: AC | PRN
  Administered 2018-01-17: 14 mL via INTRAVENOUS

## 2018-01-23 ENCOUNTER — Other Ambulatory Visit: Payer: Self-pay | Admitting: General Surgery

## 2018-01-23 ENCOUNTER — Other Ambulatory Visit: Payer: Self-pay

## 2018-01-23 DIAGNOSIS — R9389 Abnormal findings on diagnostic imaging of other specified body structures: Secondary | ICD-10-CM

## 2018-01-23 DIAGNOSIS — C50919 Malignant neoplasm of unspecified site of unspecified female breast: Secondary | ICD-10-CM

## 2018-01-23 HISTORY — DX: Malignant neoplasm of unspecified site of unspecified female breast: C50.919

## 2018-01-25 ENCOUNTER — Ambulatory Visit
Admission: RE | Admit: 2018-01-25 | Discharge: 2018-01-25 | Disposition: A | Discharge status: Home / Self Care | Payer: BLUE CROSS/BLUE SHIELD | Source: Ambulatory Visit | Attending: General Surgery | Admitting: General Surgery

## 2018-01-25 ENCOUNTER — Other Ambulatory Visit: Payer: Self-pay | Admitting: General Surgery

## 2018-01-25 ENCOUNTER — Encounter: Payer: Self-pay | Admitting: *Deleted

## 2018-01-25 DIAGNOSIS — N6312 Unspecified lump in the right breast, upper inner quadrant: Secondary | ICD-10-CM | POA: Diagnosis not present

## 2018-01-25 DIAGNOSIS — R9389 Abnormal findings on diagnostic imaging of other specified body structures: Secondary | ICD-10-CM

## 2018-01-25 DIAGNOSIS — R922 Inconclusive mammogram: Secondary | ICD-10-CM | POA: Diagnosis not present

## 2018-01-25 DIAGNOSIS — N6313 Unspecified lump in the right breast, lower outer quadrant: Secondary | ICD-10-CM | POA: Diagnosis not present

## 2018-01-25 DIAGNOSIS — N6311 Unspecified lump in the right breast, upper outer quadrant: Secondary | ICD-10-CM | POA: Diagnosis not present

## 2018-01-25 DIAGNOSIS — N6489 Other specified disorders of breast: Secondary | ICD-10-CM | POA: Diagnosis not present

## 2018-01-25 DIAGNOSIS — N6310 Unspecified lump in the right breast, unspecified quadrant: Secondary | ICD-10-CM | POA: Diagnosis not present

## 2018-01-25 DIAGNOSIS — D241 Benign neoplasm of right breast: Secondary | ICD-10-CM | POA: Diagnosis not present

## 2018-01-25 DIAGNOSIS — N62 Hypertrophy of breast: Secondary | ICD-10-CM | POA: Diagnosis not present

## 2018-01-31 ENCOUNTER — Telehealth: Payer: Self-pay | Admitting: Genetics

## 2018-01-31 NOTE — Telephone Encounter (Signed)
Revealed negative genetic testing.  Revealed that a VUS's in ATM and BARD1 were identified.   This normal result is reassuring and indicates that it is unlikely Ariel Tran's cancer is due to a hereditary cause.  It is unlikely that there is an increased risk of another cancer due to a mutation in one of these genes.  However, genetic testing is not perfect, and cannot definitively rule out a hereditary cause.  It will be important for her to keep in contact with genetics to learn if any additional testing may be needed in the future.     Recommended all relatives inform their doctors about the family history of cancer so they can make the best screening recommendations for them.

## 2018-02-01 ENCOUNTER — Other Ambulatory Visit (HOSPITAL_COMMUNITY): Payer: Self-pay | Admitting: General Surgery

## 2018-02-01 ENCOUNTER — Other Ambulatory Visit: Payer: Self-pay | Admitting: General Surgery

## 2018-02-01 ENCOUNTER — Inpatient Hospital Stay: Admission: RE | Admit: 2018-02-01 | Payer: BLUE CROSS/BLUE SHIELD | Source: Ambulatory Visit

## 2018-02-01 ENCOUNTER — Other Ambulatory Visit: Payer: Self-pay | Admitting: *Deleted

## 2018-02-01 DIAGNOSIS — Z170 Estrogen receptor positive status [ER+]: Principal | ICD-10-CM

## 2018-02-01 DIAGNOSIS — R0781 Pleurodynia: Secondary | ICD-10-CM

## 2018-02-01 DIAGNOSIS — C50412 Malignant neoplasm of upper-outer quadrant of left female breast: Secondary | ICD-10-CM

## 2018-02-01 DIAGNOSIS — C50212 Malignant neoplasm of upper-inner quadrant of left female breast: Secondary | ICD-10-CM | POA: Diagnosis not present

## 2018-02-01 MED ORDER — LETROZOLE 2.5 MG PO TABS: 2.5000 mg | ORAL_TABLET | Freq: Every day | ORAL | 1 refills | Status: DC

## 2018-02-04 ENCOUNTER — Ambulatory Visit (HOSPITAL_COMMUNITY)
Admission: RE | Admit: 2018-02-04 | Discharge: 2018-02-04 | Disposition: A | Discharge status: Home / Self Care | Payer: BLUE CROSS/BLUE SHIELD | Source: Ambulatory Visit | Attending: General Surgery | Admitting: General Surgery

## 2018-02-04 ENCOUNTER — Encounter (HOSPITAL_COMMUNITY)
Admission: RE | Admit: 2018-02-04 | Discharge: 2018-02-04 | Disposition: A | Discharge status: Home / Self Care | Payer: BLUE CROSS/BLUE SHIELD | Source: Ambulatory Visit | Attending: General Surgery | Admitting: General Surgery

## 2018-02-04 DIAGNOSIS — R0602 Shortness of breath: Secondary | ICD-10-CM | POA: Diagnosis not present

## 2018-02-04 DIAGNOSIS — R0781 Pleurodynia: Secondary | ICD-10-CM | POA: Insufficient documentation

## 2018-02-04 DIAGNOSIS — R079 Chest pain, unspecified: Secondary | ICD-10-CM | POA: Diagnosis not present

## 2018-02-04 MED ORDER — TECHNETIUM TC 99M DIETHYLENETRIAME-PENTAACETIC ACID
31.3000 | Freq: Once | INTRAVENOUS | Status: AC | PRN
  Administered 2018-02-04: 31.3 via RESPIRATORY_TRACT

## 2018-02-06 ENCOUNTER — Ambulatory Visit: Payer: Self-pay | Admitting: Genetics

## 2018-02-06 ENCOUNTER — Telehealth: Payer: Self-pay | Admitting: *Deleted

## 2018-02-06 ENCOUNTER — Encounter: Payer: Self-pay | Admitting: Genetics

## 2018-02-06 DIAGNOSIS — Z8042 Family history of malignant neoplasm of prostate: Secondary | ICD-10-CM

## 2018-02-06 DIAGNOSIS — C50212 Malignant neoplasm of upper-inner quadrant of left female breast: Secondary | ICD-10-CM

## 2018-02-06 DIAGNOSIS — Z808 Family history of malignant neoplasm of other organs or systems: Secondary | ICD-10-CM

## 2018-02-06 DIAGNOSIS — Z803 Family history of malignant neoplasm of breast: Secondary | ICD-10-CM

## 2018-02-06 DIAGNOSIS — Z1379 Encounter for other screening for genetic and chromosomal anomalies: Secondary | ICD-10-CM

## 2018-02-06 DIAGNOSIS — Z170 Estrogen receptor positive status [ER+]: Secondary | ICD-10-CM

## 2018-02-06 MED ORDER — TAMOXIFEN CITRATE 10 MG PO TABS: 10.0000 mg | ORAL_TABLET | Freq: Every day | ORAL | 1 refills | Status: DC

## 2018-02-06 NOTE — Progress Notes (Signed)
HPI:  Ms. Goetsch was previously seen in the [facility] clinic on 01/14/2018 due to a personal and family history of cancer and concerns regarding a hereditary predisposition to cancer. Please refer to our prior cancer genetics clinic note for more information regarding Ms. Knupp's medical, social and family histories, and our assessment and recommendations, at the time. Ms. Bensman recent genetic test results were disclosed to her, as well as recommendations warranted by these results. These results and recommendations are discussed in more detail below.  CANCER HISTORY:    Malignant neoplasm of upper-inner quadrant of left breast in female, estrogen receptor positive ([place])   01/03/2018 Initial Diagnosis    Patient felt a lump in the right breast which was normal.  In that evaluation she was noted to have left breast UIQ lesion 2.7 cm at 1030 position, axilla normal, biopsy revealed grade 1-2 IDC ER 100%, PR 100%, Ki-67 3%, HER-2 negative ratio 0.97, T2 N0 stage Ib clinical stage AJCC 8      01/24/2018 Genetic Testing    Common Hereditary Cancers Panel + Melanoma Panel (51 genes).  The following genes were evaluated for sequence changes and exonic deletions/duplications: APC, ATM, AXIN2, BAP1, BARD1, BMPR1A, BRCA1, BRCA2, BRIP1, CDH1, CDK4, CDKN2A (p14ARF), CDKN2A (p16INK4a), CHEK2, CTNNA1, DICER1, EPCAM*, GREM1*, KIT, MEN1, MLH1, MSH2, MSH3, MSH6, MUTYH, NBN, NF1, PALB2, PDGFRA, PMS2, POLD1, POLE, POT1, PTEN, RAD50, RAD51C, RAD51D, RB1, SDHB, SDHC, SDHD, SMAD4, SMARCA4, STK11, TP53, TSC1, TSC2, VHL The following genes were evaluated for sequence changes only: HOXB13*, MITF*, NTHL1*, SDHA  Results:  No pathogenic variants were identified.  2 Variants of uncertain significance in the genes ATM c.2786T>C (p.Met929Thr) and BARD1 c.160A>G (p.Thr54Ala) were identified.  The date of this test report is 01/24/2018.         FAMILY HISTORY:  We obtained a detailed, 4-generation family history.   Significant diagnoses are listed below: Family History  Problem Relation Age of Onset  . Cancer Mother        lung  . Hyperlipidemia Mother   . Hypothyroidism Mother   . Thyroid disease Mother   . Breast cancer Mother        dx late 50's early 9's  . Prostate cancer Father 12       metastatic  . Diabetes Maternal Uncle   . Thyroid disease Maternal Uncle   . Cancer Paternal Grandmother        abdominal area  . Diabetes Paternal Grandmother   . Bleeding Disorder Sister   . Sjogren's syndrome Sister   . Thyroid disease Maternal Aunt   . Thyroid disease Maternal Grandmother   . Thyroid disease Maternal Grandfather   . Brain cancer Paternal Aunt 68  . Cancer Paternal Grandfather 62       metastatic to bone, thinks primary may have been prostate  . Melanoma Cousin   . Thyroid cancer Cousin        had thyroid disease prior  . Lung cancer Cousin     Ms. Caporaso has 5 children.  2 are adopted and 3 are biological.  Her biological daughter is 48 and her 2 biological sons are in their 20's.  Ms. Mood has 2 sisters and a brother with no history of cancer.    Ms. Ellerman father: died at 82 due to metastatic prostate cancer dx at 34.  Paternal aunts/Uncles: 3 paternal uncles, 2 paternal aunts.  One aunt died of brain cancer in her 56's.  Paternal cousins: no known history  of cancer, limited contact with this side of the family.  One of her father's cousins died of cancer at a young age- type of cancer unk.  Paternal grandfather: died of cancer that spread to bone- believes it started in his prostate.  Paternal grandmother:died of abdominal cancer- maybe ov?? In her 55's.  She lived a few years with this cancer.   Ms. Pettaway mother: alive, currently being treated for lung cancer.  She has a history of smoking.  She had a history of breast cancer that was diagnosed in her late 31's early 62's. She had a hysterectomy.  Maternal Aunts/Uncles: 1 maternal aunt/1 maternal uncle with no history of  cancer.  Maternal cousins: Maternal uncles chilren: 1 daughter had thyroid disease and then developed thyroid cancer, 1 daughter was just diagnosed with melanoma at 69, 1 daughter had lung cancer.  Maternal grandfather: heart disease, no history of cancer.  Maternal grandmother:heart disease, no history of cancer.   Ms. Kaman is unaware of previous family history of genetic testing for hereditary cancer risks. Patient's maternal ancestors are of [place] descent, and paternal ancestors are of German/English/Native American descent. There is no reported Ashkenazi Jewish ancestry- unk. There is no known consanguinity.  GENETIC TEST RESULTS: Genetic testing performed through [facility]'s Common Hereditary Cancers Panel + Melanoma Panel reported out on 01/24/2018 showed no pathogenic mutations. The following genes were evaluated for sequence changes and exonic deletions/duplications: APC, ATM, AXIN2, BAP1, BARD1, BMPR1A, BRCA1, BRCA2, BRIP1, CDH1, CDK4, CDKN2A (p14ARF), CDKN2A (p16INK4a), CHEK2, CTNNA1, DICER1, EPCAM*, GREM1*, KIT, MEN1, MLH1, MSH2, MSH3, MSH6, MUTYH, NBN, NF1, PALB2, PDGFRA, PMS2, POLD1, POLE, POT1, PTEN, RAD50, RAD51C, RAD51D, RB1, SDHB, SDHC, SDHD, SMAD4, SMARCA4, STK11, TP53, TSC1, TSC2, VHL The following genes were evaluated for sequence changes only: HOXB13*, MITF*, NTHL1*, SDHA.  A variant of uncertain significance (VUS) in a gene called ATM was also noted. c.2786T>C (p.Met929Thr) A variant of uncertain significance (VUS) in a gene called BARD1 was also noted. c.160A>G (p.Thr54Ala)  The test report will be scanned into EPIC and will be located under the Molecular Pathology section of the Results Review tab. A portion of the result report is included below for reference.     We discussed with Ms. Yodice that because current genetic testing is not perfect, it is possible there may be a gene mutation in one of these genes that current testing cannot detect, but that chance is small.  We  also discussed, that there could be another gene that has not yet been discovered, or that we have not yet tested, that is responsible for the cancer diagnoses in the family. It is also possible there is a hereditary cause for the cancer in the family that Ms. Clifton did not inherit and therefore was not identified in her testing.  Therefore, it is important to remain in touch with cancer genetics in the future so that we can continue to offer Ms. Dayrit the most up to date genetic testing.   Regarding the VUS's in ATM and BARD1: At this time, it is unknown if these variants are associated with increased cancer risk or if they are normal findings, but most variants such as these get reclassified to being inconsequential. They should not be used to make medical management decisions. With time, we suspect the lab will determine the significance of these variants, if any. If we do learn more about them, we will try to contact Ms. Styron to discuss it further. However, it is important to stay in  touch with [place] periodically and keep the address and phone number up to date.  ADDITIONAL GENETIC TESTING: We discussed with Ms. Gosse that there are other genes that are associated with increased cancer risk that can be analyzed. The laboratories that offer this testing look at these additional genes via a hereditary cancer gene panel. Should Ms. Forsberg wish to pursue additional genetic testing, we are happy to discuss and coordinate this testing, at any time.    CANCER SCREENING RECOMMENDATIONS: Ms. Grunow test result is considered negative (normal).  This means that we have not identified a hereditary cause for her personal and family history of cancer at this time.  This indicates that it is unlikely Ms. Cowens has an increased risk of cancer due to a mutation in one of these genes.  While reassuring, genetic testing does not definitively rule out a hereditary predisposition to cancer. It is still possible that there could be  genetic mutations that are undetectable by current technology, or genetic mutations in genes that have not been tested or identified to increase cancer risk.  Therefore, it is recommended she continue to follow the cancer management and screening guidelines provided by her oncology and primary healthcare provider. An individual's cancer risk is not determined by genetic test results alone.  Overall cancer risk assessment includes additional factors such as personal medical history, family history, etc.  These should be used to make a personalized plan for cancer prevention and surveillance.    RECOMMENDATIONS FOR FAMILY MEMBERS:  Relatives in this family might be at some increased risk of developing cancer, over the general population risk, simply due to the family history of cancer.  We recommended women in this family have a yearly mammogram beginning at age 75, or 40 years younger than the earliest onset of cancer, an annual clinical breast exam, and perform monthly breast self-exams. Women in this family should also have a gynecological exam as recommended by their primary provider. All family members should have a colonoscopy by age 7 (or as directed by their doctors).  All family members should inform their physicians about the family history of cancer so their doctors can make the most appropriate screening recommendations for them.   It is also possible there is a hereditary cause for the cancer in Ms. Vasconcelos's family that she did not inherit and therefore was not identified in her.   We recommended her paternal relatives, also have genetic counseling and testing. Ms. Mckenney will let us know if we can be of any assistance in coordinating genetic counseling and/or testing for these family members.   FOLLOW-UP: Lastly, we discussed with Ms. Belmares that cancer genetics is a rapidly advancing field and it is possible that new genetic tests will be appropriate for her and/or her family members in the future. We  encouraged her to remain in contact with cancer genetics on an annual basis so we can update her personal and family histories and let her know of advances in cancer genetics that may benefit this family.   Our contact number was provided. Ms. Diosdado questions were answered to her satisfaction, and she knows she is welcome to call us at anytime with additional questions or concerns.   Ferol Luz, MS, [facility] Certified Genetic Counselor [email or web address]

## 2018-02-06 NOTE — Telephone Encounter (Signed)
Called patient.  Unable to leave message.  No voicemail.  Will try again.

## 2018-02-14 DIAGNOSIS — C50212 Malignant neoplasm of upper-inner quadrant of left female breast: Secondary | ICD-10-CM | POA: Diagnosis not present

## 2018-02-14 DIAGNOSIS — Z170 Estrogen receptor positive status [ER+]: Secondary | ICD-10-CM | POA: Diagnosis not present

## 2018-02-26 ENCOUNTER — Telehealth: Payer: Self-pay | Admitting: Hematology and Oncology

## 2018-02-26 NOTE — Telephone Encounter (Signed)
Mailed patient calendar of upcoming July appointments per 6/4 sch message.

## 2018-02-28 ENCOUNTER — Encounter: Payer: Self-pay | Admitting: Internal Medicine

## 2018-03-01 ENCOUNTER — Other Ambulatory Visit: Payer: Self-pay | Admitting: Hematology and Oncology

## 2018-03-11 ENCOUNTER — Ambulatory Visit: Payer: BLUE CROSS/BLUE SHIELD | Admitting: Internal Medicine

## 2018-03-11 ENCOUNTER — Encounter: Payer: Self-pay | Admitting: Internal Medicine

## 2018-03-11 VITALS — BP 110/70 | HR 72 | Temp 98.7°F | Ht 64.0 in | Wt 141.0 lb

## 2018-03-11 DIAGNOSIS — J0110 Acute frontal sinusitis, unspecified: Secondary | ICD-10-CM | POA: Diagnosis not present

## 2018-03-11 DIAGNOSIS — C50212 Malignant neoplasm of upper-inner quadrant of left female breast: Secondary | ICD-10-CM | POA: Diagnosis not present

## 2018-03-11 DIAGNOSIS — Z170 Estrogen receptor positive status [ER+]: Secondary | ICD-10-CM

## 2018-03-11 MED ORDER — AMOXICILLIN-POT CLAVULANATE 875-125 MG PO TABS: 1.0000 | ORAL_TABLET | Freq: Two times a day (BID) | ORAL | 0 refills | Status: DC

## 2018-03-11 NOTE — Progress Notes (Signed)
   Subjective:    Patient ID: Ariel Tran, female    DOB: 26-Mar-1966, 52 y.o.   MRN: [id number]  HPI The patient is a 52 YO female coming in for sinus problems. Going on for about 1-2 weeks at this time. Having head pressure and chest congestion. Has tried nyquil, allegra, sudafed and mucinex without improvement. Overall symptoms are worsening. Denies SOB, chest pains. Denies fevers but some chills.  She also has some questions and concerns about her upcoming breast surgery for cancer regarding recovery and possible expanders for saline inserts. She is not sure that she wants this done but feels like maybe she should as it is covered as part of cancer surgery. This will involve 2 surgeries. She is waiting on surgery in about 2-3 weeks.   Review of Systems  Constitutional: Positive for activity change, appetite change and chills. Negative for fatigue, fever and unexpected weight change.  HENT: Positive for congestion, postnasal drip, rhinorrhea and sinus pressure. Negative for ear discharge, ear pain, sinus pain, sneezing, sore throat, tinnitus, trouble swallowing and voice change.   Eyes: Negative.   Respiratory: Positive for cough. Negative for chest tightness, shortness of breath and wheezing.   Cardiovascular: Negative.  Negative for chest pain, palpitations and leg swelling.  Gastrointestinal: Negative.  Negative for abdominal distention, abdominal pain, constipation, diarrhea, nausea and vomiting.  Musculoskeletal: Negative.  Negative for myalgias.  Skin: Negative.   Neurological: Negative.   Psychiatric/Behavioral: Negative.       Objective:   Physical Exam  Constitutional: She is oriented to person, place, and time. She appears well-developed and well-nourished.  HENT:  Head: Normocephalic and atraumatic.  Oropharynx with redness and clear drainage, nose with swollen turbinates, TMs normal bilaterally, severe frontal sinus tenderness  Eyes: EOM are normal.  Neck: Normal range of  motion. No thyromegaly present.  Cardiovascular: Normal rate and regular rhythm.  Pulmonary/Chest: Effort normal and breath sounds normal. No respiratory distress. She has no wheezes. She has no rales.  Abdominal: Soft.  Musculoskeletal: She exhibits tenderness.  Lymphadenopathy:    She has no cervical adenopathy.  Neurological: She is alert and oriented to person, place, and time.  Skin: Skin is warm and dry.   Vitals:   03/11/18 1351  BP: 110/70  Pulse: 72  Temp: 98.7 F (37.1 C)  TempSrc: Oral  SpO2: 96%  Weight: 141 lb (64 kg)  Height: 5\' 4"  (1.626 m)      Assessment & Plan:

## 2018-03-11 NOTE — Patient Instructions (Signed)
We have sent in the augmentin for the sinuses. Take 1 pill twice a day for 10 days.    

## 2018-03-12 NOTE — Assessment & Plan Note (Signed)
Awaiting surgery at this time. She is getting bilateral mastectomies with LN sampling. She does have several questions about the surgery process and pros and cons of implants and expanders. She is anxious to have the surgery now that it is scheduled.

## 2018-03-12 NOTE — Assessment & Plan Note (Signed)
Rx for augmentin to clear the sinus infection prior to breast cancer surgery.

## 2018-03-15 DIAGNOSIS — Z170 Estrogen receptor positive status [ER+]: Secondary | ICD-10-CM | POA: Diagnosis not present

## 2018-03-15 DIAGNOSIS — C50212 Malignant neoplasm of upper-inner quadrant of left female breast: Secondary | ICD-10-CM | POA: Diagnosis not present

## 2018-03-15 NOTE — H&P (Signed)
Subjective:     Patient ID: Ariel Tran is a 52 y.o. female.  HPI  Here for follow up discussion of breast reconstruction. Currently on antibiotics for sinus infection.  Presented with palpable mass in the RIGHT breast. MMG/US did not reveal a correlate. Distortion noted in the LEFT breast at 10:30 which measured 2.7 x 2.5 x 1.3 cm. Axilla was negative by [place]. Biopsy revealed IDC ER/PR + HER-2 -. MRI demonstrated extensive confluent abnormality h of the LEFT UOQ measuring 5.0 cm, crossing midline to UIQ. Suspicious enhancement in the surrounding the confluent area as well as in retroareolar left breast and in the LIQ noted. An indeterminate 1.5 cm area of enhancement in the RIGHT UIQ noted and a 93m mass in the RIGHT LOQ noted.    Additional biopsies RIGHT breast 9 o clock read as fibroadenoma and RIGHT breast 2 o clock with PASH. Patient desires bilateral mastectomies.  [place]   Plan Oncotype on final specimen.  Current DD, desires smaller. Reports she is a runner and finds breast size difficult for her. Wt up 10 lb over last year.  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  Review of Systems +congestion, sinus infection    Objective:   Physical Exam  Cardiovascular: Normal rate, regular rhythm and normal heart sounds.   Pulmonary/Chest: Effort normal and breath sounds normal.   Grade 3 ptosis bilateral SN to nipple R 29.5 L 27 cm BW R 18 L 18 (CW 13 cm) Nipple to IMF R 10 L 10 cm    Assessment:     Left breast ca multicentric, ER+    Plan:     Plan proceed with bilateral immediate expander placement. I have recommended to her skin reduction pattern mastectomy with resection NAC given breast size/ptosis. Reviewed anchor type scar.   Reviewed drains, OR length, hospital stay and recovery, limitations. Discussed process of expansion and implant based risks  including rupture, MRI surveillance for silicone implants, infection requiring surgery or removal, contracture. Reviewed risks mastectomy flap necrosis requiring additional surgery.  Discussed use of acellular dermis in reconstruction, cadaveric source, incorporation over several weeks, risk that if has seroma or infection can act as additional nidus for infection if not incorporated.  Discussed prepectoral vs sub pectoral reconstruction. Discussed with patient and benefit of this is no animation deformity, may be less pain. Risk may be more visible rippling over upper poles, greater need of ADM. Reviewed pre pectoral would require larger amount acellular dermis, more drains. Discussed any type reconstruction also risks long term displacement implant and visible rippling. If prepectoral counseled I would recommend silicone implants in future as more options that have less rippling. She presented today stating she plans for saline implants. We discussed this further today, in this setting may want to consider traditional dual plane. She feels possible benefit on no muscle cutting, no animation outweighs rippling concern and she agrees to prepectoral placement.  Reviewed reconstruction will be asensate and not stimulate. Reviewed additional risks including but not limited to risks mastectomy flap necrosis requiring additional surgery, seroma, hematoma, asymmetry, need to additional procedures, fat necrosis, DVT/PE, damage to adjacent structures, cardiopulmonary complications.  BIrene Limbo MD [facility] [phone] pin [phone]

## 2018-03-19 ENCOUNTER — Other Ambulatory Visit: Payer: Self-pay

## 2018-03-19 ENCOUNTER — Encounter (HOSPITAL_BASED_OUTPATIENT_CLINIC_OR_DEPARTMENT_OTHER): Payer: Self-pay | Admitting: *Deleted

## 2018-03-25 NOTE — H&P (Signed)
Ariel Tran Location: [facility] Patient #: [id number] DOB: [birth year] Married / Language: English / Race: White Female   History of Present Illness The patient is a 52 year old female who presents for a follow-up for Breast cancer. Pt is a 52 yo F diagnosed with LEFT breast cancer April 2019 after presenting with a palpable mass on RIGHT. She had bilateral dx mammograms. The right breast appeared normal, but the left breast had a 4.8 cm area of architectural distortion on mammogram and a 2.7 cm mass on u/s (10:30 location). The axilla was negative. An u/s guided core needle biopsy was done demonstrating a grade 1-2 invasive ductal carcinoma with DCIS, hormone receptor positive, her 2 negative, Ki67 3%. She has been quite anxious as her sister in law is having recurrent breast cancer that is metastatic and worsening. She states that her dog has been sniffing that side of her chest repeatedly. Of note, on her clip films, the biopsy clip was noted to be about 1.6 cm away from center of architectural distortion (was done with u/s). She had a mother with breast cancer as well as a father with prostate cancer and a paternal grandmother with ovarian cancer. She had menarche at age 32. She is still having periods that are irregular and heavy wtih clots. She used hormonal contraception for 4-5 years. She has had three children with the first at age 76. She had a colonoscopy in 1992 to eval for constipation. She had her last pap in 2010. She has never had a bone density study.   She got an MRI in order to assess the left breast better given her breast density and discrepency between u/s and mammogram. The right breast had several additional findings that were biopsied and were benign. She also had several additional concerning areas on the left. Her MRI was noted to have marked background enhancement limiting sensitivity of the MRI. Prior to getting left sided biopsies by MR  guidance, she wanted to discuss surgical planning.   Genetics showed 2 VUS, but no positive findings.   Of note, the patient reports a new symptoms of significant chest pain on the left when she takes a deep breath.    MRI 01/17/18 THREE-DIMENSIONAL MR IMAGE RENDERING ON INDEPENDENT WORKSTATION:  Three-dimensional MR images were rendered by post-processing of the original MR data on an independent workstation. The three-dimensional MR images were interpreted, and findings are reported in the following complete MRI report for this study. Three dimensional images were evaluated at the independent DynaCad workstation  COMPARISON: No prior MRI available for comparison. Correlation made with prior mammograms and ultrasounds.  FINDINGS: Breast composition: c. Heterogeneous fibroglandular tissue.  Background parenchymal enhancement: Marked. There are multiple bilateral foci of enhancement, lowering the sensitivity of MRI.  Right breast:  In the upper inner quadrant of the right breast, there is an area of non mass enhancement measuring 1.5 cm, which likely corresponds with the asymmetry identified on the diagnostic mammogram (20 second post contrast subtraction image 61), which demonstrates progressive enhancement.  In the lower outer quadrant of the right breast, anterior depth, there is an oval mass with progressive enhancement measuring 4 x 9 mm (20 second post contrast subtraction image 146).  Left breast: In the upper-outer left breast extending slightly medial to midline, there is an irregular enhancing mass, which is confluent with areas of distortion and non mass enhancement extending inferiorly. In total, these confluent areas measure 5.0 X 3.0 x 1.4 cm. The biopsy  marking clip artifact is not well seen, but in correlation with the patient's mammogram I believe it to be within the dominant area of enhancement in the superior aspect of this confluent  abnormality.  There are multiple small satellite lesions scattered surrounding this main area of enhancement occupying much of the upper-outer quadrant. There are, however, other suspicious enhancing lesions in other quadrants of the breast such as a retroareolar enhancing mass with washout kinetics in image 140 measuring 5 mm, and a 9 mm enhancing mass in the lower-inner quadrant of the left breast, middle depth in image 155.  Lymph nodes: No abnormal appearing lymph nodes.  Ancillary findings: None.  IMPRESSION: 1. There is an extensive confluent abnormality occupying much of the upper-outer quadrant measuring approximately 5.0 cm. This enhancement crosses midline to the upper inner left breast. Additionally, there are foci of suspicious enhancement in the surrounding the confluent area as well as in retroareolar left breast and in the lower-inner quadrant, highly suspicious for multicentric disease.  2. There is an indeterminate 1.5 cm area of enhancement in the upper inner quadrant of the right breast in the location of the asymmetry identified on the diagnostic mammogram.  3. There is an indeterminate oval mass in the lower outer quadrant of the right breast measuring 9 mm.  4. No evidence of axillary lymphadenopathy.  5. Note that the motion and the marked background parenchymal enhancement and bilateral enhancing foci limit the sensitivity of this MRI.  RECOMMENDATION: 1. If breast conservation is being considered, additional biopsies of the left breast are recommended to evaluate for multicentric disease. I would recommend second-look ultrasound for possible satellite lesions in the retroareolar and in the lower-inner left breast. If not seen sonographically, MRI biopsy could be performed.  2. Stereotactic biopsy is recommended for the area of non mass enhancement in the upper inner quadrant of the right breast.  3. Second-look ultrasound is recommended for the  oval enhancing mass in the lower outer quadrant of the right breast. If this is not identified sonographically, MRI guided biopsy is recommended.  BI-RADS CATEGORY 4: Suspicious.  pathology 01/03/2018 Diagnosis Breast, left, needle core biopsy, 10:30 o'clock - INVASIVE DUCTAL CARCINOMA, GRADE II. SEE NOTE. - DUCTAL CARCINOMA IN SITU, INTERMEDIATE GRADE.  Estrogen Receptor: 100%, POSITIVE, STRONG STAINING INTENSITY Progesterone Receptor: 100%, POSITIVE, STRONG STAINING INTENSITY Proliferation Marker Ki67: 3%  Results: HER2 - NEGATIVE RATIO OF HER2/CEP17 SIGNALS 0.97 AVERAGE HER2 COPY NUMBER PER CELL 1.90   Allergies (April Staton, CMA; 02/01/2018 11:42 AM) Chicken Meat (Diagnostic) *DIAGNOSTIC PRODUCTS*  Pt has a chicken allergy, citrus and eggs/egg-derived products Levothyroxine Sodium *CHEMICALS*   Medication History (April Staton, CMA; 02/01/2018 11:42 AM) LORazepam (1MG Tablet, Oral) Active. Adderall (15MG Tablet, Oral) Active. Levothyroxine Sodium (75MCG Tablet, Oral) Active. Medications Reconciled    Review of System All other systems negative  Vitals  Weight: 140.13 lb Height: 64in Body Surface Area: 1.68 m Body Mass Index: 24.05 kg/m  Temp.: 98.14F(Oral)  Pulse: 80 (Regular)  BP: 140/82 (Sitting, Left Arm, Standard)       Physical Exam  General Mental Status-Alert. General Appearance-Consistent with stated age. Hydration-Well hydrated. Voice-Normal.  Integumentary Note: no LE edema.   Head and Neck Head-normocephalic, atraumatic with no lesions or palpable masses.  Chest and Lung Exam Chest and lung exam reveals -quiet, even and easy respiratory effort with no use of accessory muscles. Inspection Chest Wall - Normal. Back - normal.  Cardiovascular Cardiovascular examination reveals -normal pedal pulses bilaterally. Note: regular rate  and rhythm  Neurologic Neurologic evaluation reveals -alert and  oriented x 3 with no impairment of recent or remote memory. Mental Status-Normal.  Musculoskeletal Global Assessment -Note: no gross deformities.  Normal Exam - Left-Upper Extremity Strength Normal and Lower Extremity Strength Normal. Normal Exam - Right-Upper Extremity Strength Normal and Lower Extremity Strength Normal.    Assessment & Plan  PRIMARY CANCER OF UPPER INNER QUADRANT OF LEFT FEMALE BREAST (C50.212) Impression: With discussing the patient's MRI findings, she desires bilateral mastectomies. Her left breast is smaller than the right, and she would need additional biopsies and contralateral reduction to have reasonable symmetry post op.  She desires right mastectomy because she had a palpable mass and then needed 2 biopsies, and also has a bright background enhancement. She still has significant family history. Her right breast is large enough that it would be difficult to have symmetry with implant reconstruction.  I reviewed risks of surgery including bleeding and infection. We discussed that final plan for radiation would depend on tumor size > 5 cm on pathology, positive node, or positive margins.  She will see plastics for final surgical plan.  35 min spent in evaluation, examination, counseling, and coordination of care. >50% spent in counseling. Current Plans You are being scheduled for surgery- Our schedulers will call you.  You should hear from our office's scheduling department within 5 working days about the location, date, and time of surgery. We try to make accommodations for patient's preferences in scheduling surgery, but sometimes the OR schedule or the surgeon's schedule prevents [place] from making those accommodations.  If you have not heard from our office [phone]) in 5 working days, call the office and ask for your surgeon's nurse.  If you have other questions about your diagnosis, plan, or surgery, call the office and ask for your surgeon's  nurse.  Referred to Surgery - Plastic, for evaluation and follow up (Plastic Surgery). Routine. PLEURITIC CHEST PAIN (R07.81) Impression: The patient's chest pain is concerning. She is a very active person, and this is a new finding. Will get chest CT to r/o PE. Current Plans CT ANGIOGRAM CHEST W CONTRAST [phone]) (pleuritic chest pain, left breast cancer, r/o PE)

## 2018-03-25 NOTE — Progress Notes (Signed)
Husband here to pick up ensure presurgery drink and hibiclens. Instructions taped to ensure bottle to finish drink by 0730 morning of surgery. Instructed to shower using 1/2 hibiclens soap tonight and shower using remaining amount in morning before surgery. Instructed not to get in eyes and that shower floor may become slick, to use caution. Husband verbalized understanding of drink and soap usage.

## 2018-03-26 ENCOUNTER — Encounter (HOSPITAL_COMMUNITY)
Admission: RE | Admit: 2018-03-26 | Discharge: 2018-03-26 | Disposition: A | Discharge status: Home / Self Care | Payer: BLUE CROSS/BLUE SHIELD | Source: Ambulatory Visit | Attending: General Surgery | Admitting: General Surgery

## 2018-03-26 ENCOUNTER — Encounter (HOSPITAL_BASED_OUTPATIENT_CLINIC_OR_DEPARTMENT_OTHER)
Admission: RE | Disposition: A | Discharge status: Home / Self Care | Payer: Self-pay | Source: Ambulatory Visit | Attending: General Surgery

## 2018-03-26 ENCOUNTER — Ambulatory Visit (HOSPITAL_BASED_OUTPATIENT_CLINIC_OR_DEPARTMENT_OTHER): Payer: BLUE CROSS/BLUE SHIELD | Admitting: Anesthesiology

## 2018-03-26 ENCOUNTER — Other Ambulatory Visit: Payer: Self-pay

## 2018-03-26 ENCOUNTER — Encounter (HOSPITAL_BASED_OUTPATIENT_CLINIC_OR_DEPARTMENT_OTHER): Payer: Self-pay | Admitting: Anesthesiology

## 2018-03-26 ENCOUNTER — Ambulatory Visit (HOSPITAL_BASED_OUTPATIENT_CLINIC_OR_DEPARTMENT_OTHER)
Admission: RE | Admit: 2018-03-26 | Discharge: 2018-03-27 | Disposition: A | Discharge status: Home / Self Care | Payer: BLUE CROSS/BLUE SHIELD | Source: Ambulatory Visit | Attending: General Surgery | Admitting: General Surgery

## 2018-03-26 DIAGNOSIS — Z803 Family history of malignant neoplasm of breast: Secondary | ICD-10-CM | POA: Insufficient documentation

## 2018-03-26 DIAGNOSIS — D241 Benign neoplasm of right breast: Secondary | ICD-10-CM | POA: Insufficient documentation

## 2018-03-26 DIAGNOSIS — Z79899 Other long term (current) drug therapy: Secondary | ICD-10-CM | POA: Diagnosis not present

## 2018-03-26 DIAGNOSIS — G8918 Other acute postprocedural pain: Secondary | ICD-10-CM | POA: Diagnosis not present

## 2018-03-26 DIAGNOSIS — Z170 Estrogen receptor positive status [ER+]: Secondary | ICD-10-CM | POA: Diagnosis not present

## 2018-03-26 DIAGNOSIS — J329 Chronic sinusitis, unspecified: Secondary | ICD-10-CM | POA: Diagnosis not present

## 2018-03-26 DIAGNOSIS — E039 Hypothyroidism, unspecified: Secondary | ICD-10-CM | POA: Insufficient documentation

## 2018-03-26 DIAGNOSIS — C50912 Malignant neoplasm of unspecified site of left female breast: Secondary | ICD-10-CM | POA: Diagnosis present

## 2018-03-26 DIAGNOSIS — C50212 Malignant neoplasm of upper-inner quadrant of left female breast: Secondary | ICD-10-CM | POA: Diagnosis not present

## 2018-03-26 DIAGNOSIS — C50412 Malignant neoplasm of upper-outer quadrant of left female breast: Secondary | ICD-10-CM

## 2018-03-26 HISTORY — PX: MASTECTOMY W/ SENTINEL NODE BIOPSY: SHX2001

## 2018-03-26 HISTORY — PX: BREAST RECONSTRUCTION WITH PLACEMENT OF TISSUE EXPANDER AND ALLODERM: SHX6805

## 2018-03-26 SURGERY — MASTECTOMY WITH SENTINEL LYMPH NODE BIOPSY
Anesthesia: General | Site: Breast | Laterality: Bilateral

## 2018-03-26 MED ORDER — MIDAZOLAM HCL 5 MG/5ML IJ SOLN
INTRAMUSCULAR | Status: DC | PRN
  Administered 2018-03-26: 2 mg via INTRAVENOUS

## 2018-03-26 MED ORDER — BUPIVACAINE-EPINEPHRINE (PF) 0.5% -1:200000 IJ SOLN
INTRAMUSCULAR | Status: DC | PRN
  Administered 2018-03-26: 40 mL

## 2018-03-26 MED ORDER — ACETAMINOPHEN 500 MG PO TABS
ORAL_TABLET | ORAL | Status: AC
  Filled 2018-03-26: qty 2

## 2018-03-26 MED ORDER — OXYCODONE HCL 5 MG PO TABS
5.0000 mg | ORAL_TABLET | ORAL | Status: DC | PRN
  Administered 2018-03-27 (×2): 5 mg via ORAL
  Filled 2018-03-26 (×2): qty 1

## 2018-03-26 MED ORDER — ONDANSETRON HCL 4 MG/2ML IJ SOLN
INTRAMUSCULAR | Status: DC | PRN
  Administered 2018-03-26 (×2): 4 mg via INTRAVENOUS

## 2018-03-26 MED ORDER — FENTANYL CITRATE (PF) 100 MCG/2ML IJ SOLN
50.0000 ug | INTRAMUSCULAR | Status: AC | PRN
  Administered 2018-03-26 (×3): 50 ug via INTRAVENOUS

## 2018-03-26 MED ORDER — FENTANYL CITRATE (PF) 100 MCG/2ML IJ SOLN
INTRAMUSCULAR | Status: AC
  Filled 2018-03-26: qty 2

## 2018-03-26 MED ORDER — KCL IN DEXTROSE-NACL 20-5-0.45 MEQ/L-%-% IV SOLN
INTRAVENOUS | Status: DC
  Administered 2018-03-26: 17:00:00 via INTRAVENOUS
  Filled 2018-03-26: qty 1000

## 2018-03-26 MED ORDER — HYDROMORPHONE HCL 1 MG/ML IJ SOLN
INTRAMUSCULAR | Status: AC
  Filled 2018-03-26: qty 0.5

## 2018-03-26 MED ORDER — PROPOFOL 10 MG/ML IV BOLUS
INTRAVENOUS | Status: AC
  Filled 2018-03-26: qty 20

## 2018-03-26 MED ORDER — CEFAZOLIN SODIUM-DEXTROSE 2-4 GM/100ML-% IV SOLN
2.0000 g | INTRAVENOUS | Status: AC
  Administered 2018-03-26 (×2): 2 g via INTRAVENOUS

## 2018-03-26 MED ORDER — PROMETHAZINE HCL 25 MG/ML IJ SOLN: 6.2500 mg | INTRAMUSCULAR | Status: DC | PRN

## 2018-03-26 MED ORDER — KETOROLAC TROMETHAMINE 30 MG/ML IJ SOLN
30.0000 mg | Freq: Three times a day (TID) | INTRAMUSCULAR | Status: DC
  Administered 2018-03-26 – 2018-03-27 (×2): 30 mg via INTRAVENOUS
  Filled 2018-03-26 (×2): qty 1

## 2018-03-26 MED ORDER — PHENYLEPHRINE HCL 10 MG/ML IJ SOLN
INTRAMUSCULAR | Status: AC
  Filled 2018-03-26: qty 1

## 2018-03-26 MED ORDER — ONDANSETRON 4 MG PO TBDP: 4.0000 mg | ORAL_TABLET | Freq: Four times a day (QID) | ORAL | Status: DC | PRN

## 2018-03-26 MED ORDER — SODIUM CHLORIDE 0.9 % IV SOLN
INTRAVENOUS | Status: DC | PRN
  Administered 2018-03-26: 1000 mL

## 2018-03-26 MED ORDER — PROPOFOL 10 MG/ML IV BOLUS
INTRAVENOUS | Status: DC | PRN
  Administered 2018-03-26: 130 mg via INTRAVENOUS

## 2018-03-26 MED ORDER — OXYCODONE HCL 5 MG/5ML PO SOLN: 5.0000 mg | Freq: Once | ORAL | Status: DC | PRN

## 2018-03-26 MED ORDER — CHLORHEXIDINE GLUCONATE CLOTH 2 % EX PADS: 6.0000 | MEDICATED_PAD | Freq: Once | CUTANEOUS | Status: DC

## 2018-03-26 MED ORDER — SCOPOLAMINE 1 MG/3DAYS TD PT72: 1.0000 | MEDICATED_PATCH | Freq: Once | TRANSDERMAL | Status: DC | PRN

## 2018-03-26 MED ORDER — LIDOCAINE HCL (CARDIAC) PF 100 MG/5ML IV SOSY
PREFILLED_SYRINGE | INTRAVENOUS | Status: AC
  Filled 2018-03-26: qty 5

## 2018-03-26 MED ORDER — METHOCARBAMOL 500 MG PO TABS: 500.0000 mg | ORAL_TABLET | Freq: Three times a day (TID) | ORAL | 0 refills | Status: DC | PRN

## 2018-03-26 MED ORDER — LIDOCAINE HCL (CARDIAC) PF 100 MG/5ML IV SOSY
PREFILLED_SYRINGE | INTRAVENOUS | Status: DC | PRN
  Administered 2018-03-26: 30 mg via INTRAVENOUS

## 2018-03-26 MED ORDER — ONDANSETRON HCL 4 MG/2ML IJ SOLN
INTRAMUSCULAR | Status: AC
  Filled 2018-03-26: qty 2

## 2018-03-26 MED ORDER — FENTANYL CITRATE (PF) 100 MCG/2ML IJ SOLN
INTRAMUSCULAR | Status: DC | PRN
  Administered 2018-03-26: 25 ug via INTRAVENOUS
  Administered 2018-03-26 (×2): 50 ug via INTRAVENOUS
  Administered 2018-03-26: 25 ug via INTRAVENOUS

## 2018-03-26 MED ORDER — DEXAMETHASONE SODIUM PHOSPHATE 4 MG/ML IJ SOLN
INTRAMUSCULAR | Status: DC | PRN
  Administered 2018-03-26: 10 mg via INTRAVENOUS

## 2018-03-26 MED ORDER — POVIDONE-IODINE 10 % EX SOLN
CUTANEOUS | Status: DC | PRN
  Administered 2018-03-26: 1 via TOPICAL

## 2018-03-26 MED ORDER — GABAPENTIN 300 MG PO CAPS
ORAL_CAPSULE | ORAL | Status: AC
  Filled 2018-03-26: qty 1

## 2018-03-26 MED ORDER — GABAPENTIN 300 MG PO CAPS
300.0000 mg | ORAL_CAPSULE | ORAL | Status: AC
  Administered 2018-03-26: 300 mg via ORAL

## 2018-03-26 MED ORDER — CEFAZOLIN SODIUM-DEXTROSE 2-4 GM/100ML-% IV SOLN
INTRAVENOUS | Status: AC
  Filled 2018-03-26: qty 100

## 2018-03-26 MED ORDER — SUGAMMADEX SODIUM 200 MG/2ML IV SOLN
INTRAVENOUS | Status: AC
  Filled 2018-03-26: qty 4

## 2018-03-26 MED ORDER — ACETAMINOPHEN 500 MG PO TABS
1000.0000 mg | ORAL_TABLET | ORAL | Status: AC
  Administered 2018-03-26: 1000 mg via ORAL

## 2018-03-26 MED ORDER — OXYCODONE HCL 5 MG PO TABS: 5.0000 mg | ORAL_TABLET | Freq: Once | ORAL | Status: DC | PRN

## 2018-03-26 MED ORDER — CEFAZOLIN SODIUM-DEXTROSE 1-4 GM/50ML-% IV SOLN
1.0000 g | Freq: Three times a day (TID) | INTRAVENOUS | Status: DC
  Administered 2018-03-27: 1 g via INTRAVENOUS
  Filled 2018-03-26: qty 50

## 2018-03-26 MED ORDER — DEXAMETHASONE SODIUM PHOSPHATE 10 MG/ML IJ SOLN
INTRAMUSCULAR | Status: AC
  Filled 2018-03-26: qty 1

## 2018-03-26 MED ORDER — ONDANSETRON HCL 4 MG/2ML IJ SOLN: 4.0000 mg | Freq: Four times a day (QID) | INTRAMUSCULAR | Status: DC | PRN

## 2018-03-26 MED ORDER — MEPERIDINE HCL 25 MG/ML IJ SOLN: 6.2500 mg | INTRAMUSCULAR | Status: DC | PRN

## 2018-03-26 MED ORDER — ENOXAPARIN SODIUM 40 MG/0.4ML ~~LOC~~ SOLN: 40.0000 mg | SUBCUTANEOUS | Status: DC

## 2018-03-26 MED ORDER — CELECOXIB 200 MG PO CAPS
ORAL_CAPSULE | ORAL | Status: AC
  Filled 2018-03-26: qty 1

## 2018-03-26 MED ORDER — AMPHETAMINE-DEXTROAMPHETAMINE 15 MG PO TABS: 1.0000 | ORAL_TABLET | Freq: Two times a day (BID) | ORAL | Status: DC

## 2018-03-26 MED ORDER — CEFAZOLIN SODIUM-DEXTROSE 2-4 GM/100ML-% IV SOLN
INTRAVENOUS | Status: AC
Start: 2018-03-26 — End: ?
  Filled 2018-03-26: qty 100

## 2018-03-26 MED ORDER — CLONIDINE HCL (ANALGESIA) 100 MCG/ML EP SOLN
EPIDURAL | Status: DC | PRN
  Administered 2018-03-26: 60 ug

## 2018-03-26 MED ORDER — MIDAZOLAM HCL 2 MG/2ML IJ SOLN
1.0000 mg | INTRAMUSCULAR | Status: DC | PRN
  Administered 2018-03-26: 2 mg via INTRAVENOUS

## 2018-03-26 MED ORDER — HYDROMORPHONE HCL 1 MG/ML IJ SOLN
0.5000 mg | INTRAMUSCULAR | Status: DC | PRN
  Administered 2018-03-26 – 2018-03-27 (×2): 0.5 mg via INTRAVENOUS

## 2018-03-26 MED ORDER — TECHNETIUM TC 99M SULFUR COLLOID FILTERED
1.0000 | Freq: Once | INTRAVENOUS | Status: AC | PRN
  Administered 2018-03-26: 1 via INTRADERMAL

## 2018-03-26 MED ORDER — MIDAZOLAM HCL 2 MG/2ML IJ SOLN
INTRAMUSCULAR | Status: AC
  Filled 2018-03-26: qty 2

## 2018-03-26 MED ORDER — HYDROMORPHONE HCL 1 MG/ML IJ SOLN
0.2500 mg | INTRAMUSCULAR | Status: DC | PRN
  Administered 2018-03-26: 0.5 mg via INTRAVENOUS
  Filled 2018-03-26: qty 0.5

## 2018-03-26 MED ORDER — LACTATED RINGERS IV SOLN
INTRAVENOUS | Status: DC
  Administered 2018-03-26 (×3): via INTRAVENOUS

## 2018-03-26 MED ORDER — CELECOXIB 200 MG PO CAPS
200.0000 mg | ORAL_CAPSULE | ORAL | Status: AC
  Administered 2018-03-26: 200 mg via ORAL

## 2018-03-26 MED ORDER — METHOCARBAMOL 500 MG PO TABS
500.0000 mg | ORAL_TABLET | Freq: Three times a day (TID) | ORAL | Status: DC | PRN
  Administered 2018-03-26 – 2018-03-27 (×2): 500 mg via ORAL
  Filled 2018-03-26 (×2): qty 1

## 2018-03-26 MED ORDER — SULFAMETHOXAZOLE-TRIMETHOPRIM 800-160 MG PO TABS: 1.0000 | ORAL_TABLET | Freq: Two times a day (BID) | ORAL | 0 refills | Status: DC

## 2018-03-26 MED ORDER — EPHEDRINE SULFATE 50 MG/ML IJ SOLN
INTRAMUSCULAR | Status: DC | PRN
  Administered 2018-03-26: 10 mg via INTRAVENOUS

## 2018-03-26 MED ORDER — PHENYLEPHRINE HCL 10 MG/ML IJ SOLN
INTRAVENOUS | Status: DC | PRN
  Administered 2018-03-26: 25 ug/min via INTRAVENOUS

## 2018-03-26 MED ORDER — ROCURONIUM BROMIDE 100 MG/10ML IV SOLN
INTRAVENOUS | Status: DC | PRN
  Administered 2018-03-26: 40 mg via INTRAVENOUS

## 2018-03-26 MED ORDER — SUGAMMADEX SODIUM 200 MG/2ML IV SOLN
INTRAVENOUS | Status: DC | PRN
  Administered 2018-03-26: 150 mg via INTRAVENOUS

## 2018-03-26 MED ORDER — OXYCODONE HCL 5 MG PO TABS: 5.0000 mg | ORAL_TABLET | ORAL | 0 refills | Status: DC | PRN

## 2018-03-26 MED ORDER — ROCURONIUM BROMIDE 10 MG/ML (PF) SYRINGE
PREFILLED_SYRINGE | INTRAVENOUS | Status: AC
  Filled 2018-03-26: qty 10

## 2018-03-26 MED ORDER — PHENYLEPHRINE HCL 10 MG/ML IJ SOLN
INTRAMUSCULAR | Status: DC | PRN
  Administered 2018-03-26 (×3): 80 ug via INTRAVENOUS

## 2018-03-26 SURGICAL SUPPLY — 80 items
ADH SKN CLS APL DERMABOND .7 (GAUZE/BANDAGES/DRESSINGS) ×2
ALLODERM 16X20 PERFORATED (Tissue) ×2 IMPLANT
ALLOGRAFT PERF 16X20 2.4+/-0.4 (Tissue) ×2 IMPLANT
BAG DECANTER FOR FLEXI CONT (MISCELLANEOUS) ×3 IMPLANT
BINDER BREAST LRG (GAUZE/BANDAGES/DRESSINGS) IMPLANT
BINDER BREAST XLRG (GAUZE/BANDAGES/DRESSINGS) ×2 IMPLANT
BIOPATCH RED 1 DISK 7.0 (GAUZE/BANDAGES/DRESSINGS) IMPLANT
BIOPATCH RED 1IN DISK 7.0MM (GAUZE/BANDAGES/DRESSINGS)
BLADE HEX COATED 2.75 (ELECTRODE) ×3 IMPLANT
BLADE SURG 10 STRL SS (BLADE) ×5 IMPLANT
BLADE SURG 15 STRL LF DISP TIS (BLADE) ×1 IMPLANT
BLADE SURG 15 STRL SS (BLADE) ×3
BNDG GAUZE ELAST 4 BULKY (GAUZE/BANDAGES/DRESSINGS) ×2 IMPLANT
CANISTER SUCT 1200ML W/VALVE (MISCELLANEOUS) ×5 IMPLANT
CHLORAPREP W/TINT 26ML (MISCELLANEOUS) ×5 IMPLANT
CLIP VESOCCLUDE MED 6/CT (CLIP) ×12 IMPLANT
CLOSURE WOUND 1/2 X4 (GAUZE/BANDAGES/DRESSINGS)
COVER MAYO STAND STRL (DRAPES) ×6 IMPLANT
COVER PROBE W GEL 5X96 (DRAPES) ×3 IMPLANT
DERMABOND ADVANCED (GAUZE/BANDAGES/DRESSINGS) ×4
DERMABOND ADVANCED .7 DNX12 (GAUZE/BANDAGES/DRESSINGS) ×1 IMPLANT
DRAIN CHANNEL 15F RND FF W/TCR (WOUND CARE) ×4 IMPLANT
DRAIN CHANNEL 19F RND (DRAIN) ×5 IMPLANT
DRAPE UTILITY XL STRL (DRAPES) ×3 IMPLANT
DRSG PAD ABDOMINAL 8X10 ST (GAUZE/BANDAGES/DRESSINGS) ×8 IMPLANT
DRSG TEGADERM 4X10 (GAUZE/BANDAGES/DRESSINGS) ×8 IMPLANT
ELECT BLADE 4.0 EZ CLEAN MEGAD (MISCELLANEOUS) ×3
ELECT COATED BLADE 2.86 ST (ELECTRODE) ×3 IMPLANT
ELECT REM PT RETURN 9FT ADLT (ELECTROSURGICAL) ×3
ELECTRODE BLDE 4.0 EZ CLN MEGD (MISCELLANEOUS) ×1 IMPLANT
ELECTRODE REM PT RTRN 9FT ADLT (ELECTROSURGICAL) ×1 IMPLANT
EVACUATOR SILICONE 100CC (DRAIN) ×9 IMPLANT
EXPANDER BREAST TISSUE 400CC (Breast) ×4 IMPLANT
GAUZE SPONGE 4X4 12PLY STRL (GAUZE/BANDAGES/DRESSINGS) ×1 IMPLANT
GLOVE BIO SURGEON STRL SZ 6 (GLOVE) ×10 IMPLANT
GLOVE BIO SURGEON STRL SZ 6.5 (GLOVE) ×3 IMPLANT
GLOVE BIO SURGEONS STRL SZ 6.5 (GLOVE) ×3
GLOVE BIOGEL PI IND STRL 6.5 (GLOVE) ×1 IMPLANT
GLOVE BIOGEL PI IND STRL 7.0 (GLOVE) IMPLANT
GLOVE BIOGEL PI INDICATOR 6.5 (GLOVE) ×2
GLOVE BIOGEL PI INDICATOR 7.0 (GLOVE) ×2
GOWN STRL REUS W/ TWL LRG LVL3 (GOWN DISPOSABLE) ×4 IMPLANT
GOWN STRL REUS W/TWL 2XL LVL3 (GOWN DISPOSABLE) ×3 IMPLANT
GOWN STRL REUS W/TWL LRG LVL3 (GOWN DISPOSABLE) ×12
KIT FILL SYSTEM UNIVERSAL (SET/KITS/TRAYS/PACK) ×2 IMPLANT
LIGHT WAVEGUIDE WIDE FLAT (MISCELLANEOUS) ×2 IMPLANT
NS IRRIG 1000ML POUR BTL (IV SOLUTION) ×3 IMPLANT
PACK BASIN DAY SURGERY FS (CUSTOM PROCEDURE TRAY) ×3 IMPLANT
PACK UNIVERSAL I (CUSTOM PROCEDURE TRAY) ×5 IMPLANT
PENCIL BUTTON HOLSTER BLD 10FT (ELECTRODE) ×3 IMPLANT
PIN SAFETY STERILE (MISCELLANEOUS) ×5 IMPLANT
SHEET MEDIUM DRAPE 40X70 STRL (DRAPES) ×3 IMPLANT
SLEEVE SCD COMPRESS KNEE MED (MISCELLANEOUS) ×3 IMPLANT
SPONGE LAP 18X18 RF (DISPOSABLE) ×16 IMPLANT
STAPLER VISISTAT 35W (STAPLE) ×3 IMPLANT
STOCKINETTE IMPERVIOUS LG (DRAPES) ×2 IMPLANT
STRIP CLOSURE SKIN 1/2X4 (GAUZE/BANDAGES/DRESSINGS) ×1 IMPLANT
SUT CHROMIC 4 0 PS 2 18 (SUTURE) ×12 IMPLANT
SUT ETHILON 2 0 FS 18 (SUTURE) ×5 IMPLANT
SUT MNCRL AB 4-0 PS2 18 (SUTURE) ×12 IMPLANT
SUT SILK 0 TIES 10X30 (SUTURE) ×1 IMPLANT
SUT SILK 2 0 SH (SUTURE) ×2 IMPLANT
SUT VIC AB 3-0 SH 27 (SUTURE) ×12
SUT VIC AB 3-0 SH 27X BRD (SUTURE) IMPLANT
SUT VICRYL 0 CT-2 (SUTURE) ×8 IMPLANT
SUT VICRYL 3-0 CR8 SH (SUTURE) ×2 IMPLANT
SUT VICRYL 4-0 PS2 18IN ABS (SUTURE) ×4 IMPLANT
SUT VICRYL AB 2 0 TIE (SUTURE) ×1 IMPLANT
SUT VICRYL AB 2 0 TIES (SUTURE)
SUT VLOC 180 0 24IN GS25 (SUTURE) ×4 IMPLANT
SYR 50ML LL SCALE MARK (SYRINGE) ×3 IMPLANT
SYR BULB IRRIGATION 50ML (SYRINGE) ×5 IMPLANT
TOWEL GREEN STERILE FF (TOWEL DISPOSABLE) ×5 IMPLANT
TOWEL OR NON WOVEN STRL DISP B (DISPOSABLE) ×3 IMPLANT
TRAY DSU PREP LF (CUSTOM PROCEDURE TRAY) ×3 IMPLANT
TRAY FOLEY W/BAG SLVR 14FR (SET/KITS/TRAYS/PACK) ×2 IMPLANT
TUBE CONNECTING 20'X1/4 (TUBING) ×2
TUBE CONNECTING 20X1/4 (TUBING) ×3 IMPLANT
UNDERPAD 30X30 (UNDERPADS AND DIAPERS) ×6 IMPLANT
YANKAUER SUCT BULB TIP NO VENT (SUCTIONS) ×3 IMPLANT

## 2018-03-26 NOTE — Progress Notes (Signed)
Assisted Dr. Lissa Hoard with right, left, ultrasound guided, pectoralis block. Side rails up, monitors on throughout procedure. See vital signs in flow sheet. Tolerated Procedure well.

## 2018-03-26 NOTE — Interval H&P Note (Signed)
History and Physical Interval Note:  03/26/2018 10:37 AM  Ariel Tran  has presented today for surgery, with the diagnosis of LEFT BREAST CANCER, DENSE BREAST TISSUE,FAMILY HISTORY OF BREAST CANCER  The various methods of treatment have been discussed with the patient and family. After consideration of risks, benefits and other options for treatment, the patient has consented to  Procedure(s): BILATERAL MASTECTOMIES WITH LEFT SENTINEL LYMPH NODE BIOPSY (Bilateral) BREAST RECONSTRUCTION WITH PLACEMENT OF TISSUE EXPANDER AND ALLODERM (Bilateral) as a surgical intervention .  The patient's history has been reviewed, patient examined, no change in status, stable for surgery.  I have reviewed the patient's chart and labs.  Questions were answered to the patient's satisfaction.     Stark Klein

## 2018-03-26 NOTE — Transfer of Care (Signed)
Immediate Anesthesia Transfer of Care Note  Patient: Ariel Tran  Procedure(s) Performed: BILATERAL MASTECTOMIES WITH LEFT SENTINEL LYMPH NODE BIOPSY (Bilateral Breast) BREAST RECONSTRUCTION WITH PLACEMENT OF TISSUE EXPANDER AND ACELLULAR DERMIS (Bilateral Breast)  Patient Location: PACU  Anesthesia Type:GA combined with regional for post-op pain  Level of Consciousness: awake, alert , oriented and drowsy  Airway & Oxygen Therapy: Patient Spontanous Breathing and Patient connected to face mask oxygen  Post-op Assessment: Report given to RN and Post -op Vital signs reviewed and stable  Post vital signs: Reviewed and stable  Last Vitals:  Vitals Value Taken Time  BP    Temp    Pulse    Resp    SpO2      Last Pain:  Vitals:   03/26/18 1030  TempSrc: Oral  PainSc: 0-No pain         Complications: No apparent anesthesia complications

## 2018-03-26 NOTE — Anesthesia Procedure Notes (Signed)
Procedure Name: Intubation Date/Time: 03/26/2018 11:39 AM Performed by: Marrianne Mood, CRNA Pre-anesthesia Checklist: Patient identified, Emergency Drugs available, Suction available, Patient being monitored and Timeout performed Patient Re-evaluated:Patient Re-evaluated prior to induction Oxygen Delivery Method: Circle system utilized Preoxygenation: Pre-oxygenation with 100% oxygen Induction Type: IV induction Ventilation: Mask ventilation without difficulty Laryngoscope Size: Miller and 3 Grade View: Grade II Tube type: Oral Tube size: 7.0 mm Number of attempts: 1 Airway Equipment and Method: Stylet and Oral airway Placement Confirmation: ETT inserted through vocal cords under direct vision,  positive ETCO2 and breath sounds checked- equal and bilateral Secured at: 23 cm Tube secured with: Tape Dental Injury: Teeth and Oropharynx as per pre-operative assessment

## 2018-03-26 NOTE — Discharge Instructions (Signed)
About my Jackson-Pratt Bulb Drain  What is a Jackson-Pratt bulb? A Jackson-Pratt is a soft, round device used to collect drainage. It is connected to a long, thin drainage catheter, which is held in place by one or two small stiches near your surgical incision site. When the bulb is squeezed, it forms a vacuum, forcing the drainage to empty into the bulb.  Emptying the Jackson-Pratt bulb- To empty the bulb: 1. Release the plug on the top of the bulb. 2. Pour the bulb's contents into a measuring container which your nurse will provide. 3. Record the time emptied and amount of drainage. Empty the drain(s) as often as your     doctor or nurse recommends.  Date                  Time                    Amount (Drain 1)                 Amount (Drain 2)                     Amount (Drain 3)                        Amount (Drain 4)  _____________________________________________________________________  _____________________________________________________________________  _____________________________________________________________________  _____________________________________________________________________  _____________________________________________________________________  _____________________________________________________________________  _____________________________________________________________________  _____________________________________________________________________  Squeezing the Jackson-Pratt Bulb- To squeeze the bulb: 1. Make sure the plug at the top of the bulb is open. 2. Squeeze the bulb tightly in your fist. You will hear air squeezing from the bulb. 3. Replace the plug while the bulb is squeezed. 4. Use a safety pin to attach the bulb to your clothing. This will keep the catheter from     pulling at the bulb insertion site.  When to call your doctor- Call your doctor if: Drain site becomes red, swollen or hot. You have a fever greater than 101 degrees  F. There is oozing at the drain site. Drain falls out (apply a guaze bandage over the drain hole and secure it with tape). Drainage increases daily not related to activity patterns. (You will usually have more drainage when you are active than when you are resting.) Drainage has a bad odor.  

## 2018-03-26 NOTE — Op Note (Signed)
Bilateral skin sparing mastectomy with left Sentinel Node Biopsy  Indications: This patient presents with history of left clinically node negative breast cancer   Pre-operative Diagnosis: Left breast cancer, grade 1-2 invasive ductal, cT2N0M0, upper inner quadrant, receptors +/+/-  Post-operative Diagnosis: same  Surgeon: Stark Klein   Anesthesia: General endotracheal anesthesia and pectoral block  ASA Class: 2  Procedure Details  The patient was seen in the Holding Room. The risks, benefits, complications, treatment options, and expected outcomes were discussed with the patient. The possibilities of reaction to medication, pulmonary aspiration, bleeding, infection, the need for additional procedures, failure to diagnose a condition, and creating a complication requiring transfusion or operation were discussed with the patient. The patient concurred with the proposed plan, giving informed consent.  The site of surgery properly noted/marked. The patient was taken to Operating Room # 7, identified as Ariel Tran and the procedure verified as Bilateral skin sparing Mastectomy with left sentinel Node Biopsy. A Time Out was held and the above information confirmed.    After induction of anesthesia, the bilateral breast, chest, and left arm were prepped and draped in standard fashion.   The borders of the breast were identified and marked.  Dr. Iran Planas marked out skin incisions. The right side was addressed first.  The triangular incision was made with a #10 blade.  Mastectomy hooks were used to provide elevation of the skin edges, and the cautery was used to create the flaps.  Lighted retractors were used to assist with visualization after getting deeper into the flaps.    The dissection was taken to the fascia of the pectoralis major.  The penetrating vessels were clipped as needed.  The superior flap was taken medially to the lateral sternal border, superiorly to the inferior border of the  clavicle.  The inferior flap was similarly created, inferiorly to the inframammary fold and laterally to the border of the latissimus.  The breast was taken off including the pectoralis fascia and the axillary tail marked.    The left breast was addressed secondly.  The triangular incision was made with a #10 blade.  The flaps were created similarly.    Using a hand-held gamma probe, axillary sentinel nodes were identified.  Four deep level 2 axillary sentinel nodes were removed and submitted to pathology.  The findings are below.  The lymphovascular channels were clipped with metal clips.        The wound was irrigated.   Hemostasis was achieved with cautery. The patient was left with Dr. Iran Planas to perform reconstruction.     Findings: grossly clear surgical margins, cps SLN 1-4, highest 3600, lowest 250. Background 5.  Breast is very vascular.    Estimated Blood Loss: 100 mL          Drains: per Dr. Iran Planas.                Specimens: right breast, left breast and four deep left axillary sentinel nodes         Complications:  None; patient tolerated the procedure well.         Disposition: PACU - hemodynamically stable.         Condition: stable

## 2018-03-26 NOTE — Anesthesia Preprocedure Evaluation (Signed)
Anesthesia Evaluation  Patient identified by MRN, date of birth, ID band Patient awake    Reviewed: Allergy & Precautions, NPO status , Patient's Chart, lab work & pertinent test results  History of Anesthesia Complications (+) PONV  Airway Mallampati: II  TM Distance: >3 FB Neck ROM: Full    Dental no notable dental hx.    Pulmonary neg pulmonary ROS,    Pulmonary exam normal breath sounds clear to auscultation       Cardiovascular negative cardio ROS Normal cardiovascular exam+ Valvular Problems/Murmurs  Rhythm:Regular Rate:Normal     Neuro/Psych negative neurological ROS  negative psych ROS   GI/Hepatic negative GI ROS, Neg liver ROS,   Endo/Other  Hypothyroidism   Renal/GU negative Renal ROS     Musculoskeletal negative musculoskeletal ROS (+)   Abdominal   Peds  Hematology negative hematology ROS (+)   Anesthesia Other Findings   Reproductive/Obstetrics negative OB ROS                             Anesthesia Physical Anesthesia Plan  ASA: II  Anesthesia Plan: General   Post-op Pain Management:    Induction: Intravenous  PONV Risk Score and Plan: 4 or greater and Ondansetron, Dexamethasone, Midazolam, Scopolamine patch - Pre-op and Treatment may vary due to age or medical condition  Airway Management Planned: Oral ETT  Additional Equipment:   Intra-op Plan:   Post-operative Plan: Extubation in OR  Informed Consent: I have reviewed the patients History and Physical, chart, labs and discussed the procedure including the risks, benefits and alternatives for the proposed anesthesia with the patient or authorized representative who has indicated his/her understanding and acceptance.   Dental advisory given  Plan Discussed with: CRNA  Anesthesia Plan Comments:         Anesthesia Quick Evaluation

## 2018-03-26 NOTE — Anesthesia Procedure Notes (Signed)
Anesthesia Regional Block: Pectoralis block   Pre-Anesthetic Checklist: ,, timeout performed, Correct Patient, Correct Site, Correct Laterality, Correct Procedure, Correct Position, site marked, Risks and benefits discussed,  Surgical consent,  Pre-op evaluation,  At surgeon's request and post-op pain management  Laterality: Right and Left  Prep: chloraprep       Needles:  Injection technique: Single-shot  Needle Type: Stimiplex     Needle Length: 9cm      Additional Needles:   Procedures:,,,, ultrasound used (permanent image in chart),,,,  Narrative:  Start time: 03/26/2018 11:02 AM End time: 03/26/2018 11:14 AM Injection made incrementally with aspirations every 5 mL.  Performed by: Personally  Anesthesiologist: Nolon Nations, MD  Additional Notes: Patient tolerated well. Good fascial spread noted.

## 2018-03-26 NOTE — Op Note (Signed)
Operative Note   DATE OF OPERATION: 7.2.19  LOCATION: [facility]-observation  SURGICAL DIVISION: Plastic Surgery  PREOPERATIVE DIAGNOSES:  1. Left breast cancer multicentric ER+  POSTOPERATIVE DIAGNOSES:  same  PROCEDURE:  1. Bilateral breast reconstruction with tissue expanders 2. Acellular dermis (Alloderm) for breast reconstruction 600 cm2  SURGEON: Irene Limbo MD MBA  ASSISTANT: none  ANESTHESIA:  General.   EBL: 275 ml for entire procedure  COMPLICATIONS: None immediate.   INDICATIONS FOR PROCEDURE:  The patient, Ariel Tran, is a 52 y.o. female born on [birth year], is here for bilateral immediate prepectoral expander acellular dermis reconstruction following skin reduction pattern mastectomies.   FINDINGS: Natrelle 133FV-12-T 400 ml tissue expanders placed bilateral, initial fill volume 320 ml air. RIGHT SN [id number] LEFT [id number]  DESCRIPTION OF PROCEDURE:  The patient was marked with the patient in the preoperative area to mark sternal notch, chest midline, anterior axillary lines and inframammary folds. Patient was marked for skin reduction mastectomy with most superior portion nipple areola marked on breast meridian. Vertical limbs marked by breast displacement and set at9cm length. The patient was taken to the operating room. SCDs were placed and IV antibiotics were given. Foley catheter placed. The patient's operative site was prepped and draped in a sterile fashion. A time out was performed and all information was confirmed to be correct. In supine position, the lateral limbs for resection marked and area over lower pole preserved as inferiorly based dermal pedicle. Skin de epithelialized in this area.I assisted in mastectomiesand sentinelnode dissectionwith retraction and exposure.Following completion of mastectomies, reconstruction began onrightside.  The cavity was irrigated with solution containingAncef,bacitracin, and gentamicin. Hemostasis  was ensured. A 19 Fr drain was placed in subcutaneous position laterally anda 15 Fr drain placed along inframammary fold. Eachsecured to skin with 2-0 nylon. Cavity irrigated with Betadine.The tissue expanderswere prepared on back table prior in insertion. The expander was filled with air to330ml.Perforated acellular dermis was draped over anterior surface expander. The ADM was then secured to itself over posterior surface of expander. Redundant folds acellular dermis excised so that the ADM lied flat without folds over air filled expander.The expander was secured to medial insertion pectoralis with a 0 vicryl.The superior and lateral tabs also secured to pectoralis muscle with 0-vicryl. The ADM was secured to pectoralis muscle and chest wall along inferior border at inframammary fold.Laterally the mastectomy flap over posterior axillary line was advanced anteriorly and the subcutaneous tissue and superficial fascia was secured to pectoralis muscle and acellular dermis with 0-vicryl. The inferiorly based dermal pedicle was redraped superiorly over expander and acellular dermis and secured to pectoralis with interrupted 0-vicryl. Skin closure completedwith 3-0 vicryl in fascial layer and 4-0 vicryl in dermis. Skin closure completed with 4-0 monocryl subcuticular and tissue adhesive.  I then directed my attention toleftchest where similar irrigation and drain placement completed. The prepared expander with ADM secured over anterior surface was placed in right chest and tabs secured to chest wall and pectoralis muscle with 0- vicryl suture. The acellular dermis at inframammary fold was secured to chest wall with 0 V-lock suture.Laterally the mastectomy flap over posterior axillary line was advanced anteriorly and the subcutaneous tissue and superficial fascia was secured to pectoralis muscle and acellular dermis with 0-vicryl. The inferiorly based dermal pedicle was redraped superiorly over expander  and acellular dermis and secured to pectoralis with interrupted 0-vicryl. Skin closure completedwith 3-0 vicryl in fascial layer and 4-0 vicryl in dermis. Skin closure completed with 4-0 monocryl subcuticular and tissue  adhesive.Tegaderms applied bilateral, followed by dry dressing and breast binder.  The patient was allowed to wake from anesthesia, extubated and taken to the recovery room in satisfactory condition.   SPECIMENS: none  DRAINS: 15 and 19 Fr JP in right and left breast reconstruction  Irene Limbo, MD [facility]astic [facility] [phone], pin [phone]

## 2018-03-26 NOTE — Progress Notes (Signed)
Nuc med injection performed by nuc med staff. Additional fentanyl given for comfort. Patient tolerated well. Vitals stable.

## 2018-03-26 NOTE — Interval H&P Note (Signed)
History and Physical Interval Note:  03/26/2018 11:01 AM  Ariel Tran  has presented today for surgery, with the diagnosis of LEFT BREAST CANCER, DENSE BREAST TISSUE,FAMILY HISTORY OF BREAST CANCER  The various methods of treatment have been discussed with the patient and family. After consideration of risks, benefits and other options for treatment, the patient has consented to  Procedure(s): BILATERAL MASTECTOMIES WITH LEFT SENTINEL LYMPH NODE BIOPSY (Bilateral) BREAST RECONSTRUCTION WITH PLACEMENT OF TISSUE EXPANDER AND ALLODERM (Bilateral) as a surgical intervention .  The patient's history has been reviewed, patient examined, no change in status, stable for surgery.  I have reviewed the patient's chart and labs.  Questions were answered to the patient's satisfaction.     Dorthia Tout

## 2018-03-27 DIAGNOSIS — C50212 Malignant neoplasm of upper-inner quadrant of left female breast: Secondary | ICD-10-CM | POA: Diagnosis not present

## 2018-03-27 DIAGNOSIS — Z803 Family history of malignant neoplasm of breast: Secondary | ICD-10-CM | POA: Diagnosis not present

## 2018-03-27 DIAGNOSIS — D241 Benign neoplasm of right breast: Secondary | ICD-10-CM | POA: Diagnosis not present

## 2018-03-27 DIAGNOSIS — Z79899 Other long term (current) drug therapy: Secondary | ICD-10-CM | POA: Diagnosis not present

## 2018-03-27 DIAGNOSIS — J329 Chronic sinusitis, unspecified: Secondary | ICD-10-CM | POA: Diagnosis not present

## 2018-03-27 DIAGNOSIS — E039 Hypothyroidism, unspecified: Secondary | ICD-10-CM | POA: Diagnosis not present

## 2018-03-27 MED ORDER — HYDROMORPHONE HCL 1 MG/ML IJ SOLN
INTRAMUSCULAR | Status: AC
  Filled 2018-03-27: qty 0.5

## 2018-03-27 MED ORDER — PROMETHAZINE HCL 25 MG/ML IJ SOLN
INTRAMUSCULAR | Status: AC
Start: 2018-03-27 — End: ?
  Filled 2018-03-27: qty 1

## 2018-03-27 NOTE — Discharge Summary (Signed)
Admit date: 03/26/2018  Discharge date: 03/27/2018  Admission Diagnoses: Left breast cancer  Discharge Diagnoses:  same  Discharged Condition: stable  Hospital Course:  Postoperatively patient did well with minimal pain, tolerating oral medication and diet.  Treatments: surgery: bilateral mastectomies with immediate expander acellular dermis reconstruction, left SLN  Discharge Exam:  Incision/Wound: incisions intact chest soft Tegaderms in place, drains serosanguinous  Disposition:   Home  Irene Limbo, MD [facility] Plastic & Reconstructive Surgery [phone], pin [phone]

## 2018-03-29 ENCOUNTER — Encounter (HOSPITAL_BASED_OUTPATIENT_CLINIC_OR_DEPARTMENT_OTHER): Payer: Self-pay | Admitting: General Surgery

## 2018-03-29 NOTE — Anesthesia Postprocedure Evaluation (Signed)
Anesthesia Post Note  Patient: Ariel Tran  Procedure(s) Performed: BILATERAL MASTECTOMIES WITH LEFT SENTINEL LYMPH NODE BIOPSY (Bilateral Breast) BREAST RECONSTRUCTION WITH PLACEMENT OF TISSUE EXPANDER AND ACELLULAR DERMIS (Bilateral Breast)     Patient location during evaluation: PACU Anesthesia Type: General Level of consciousness: awake and alert Pain management: pain level controlled Vital Signs Assessment: post-procedure vital signs reviewed and stable Respiratory status: spontaneous breathing, nonlabored ventilation, respiratory function stable and patient connected to nasal cannula oxygen Cardiovascular status: blood pressure returned to baseline and stable Postop Assessment: no apparent nausea or vomiting Anesthetic complications: no    Last Vitals:  Vitals:   03/27/18 0515 03/27/18 0715  BP:  99/65  Pulse:  82  Resp:  18  Temp:  (!) 36.2 C  SpO2: 99% 100%    Last Pain:  Vitals:   03/27/18 0815  TempSrc:   PainSc: 5                  Dres[place] Lozito COKER

## 2018-04-01 NOTE — Progress Notes (Signed)
Please let patient know margins and lymph nodes are negative.

## 2018-04-02 ENCOUNTER — Inpatient Hospital Stay: Payer: BLUE CROSS/BLUE SHIELD | Attending: Hematology and Oncology | Admitting: Hematology and Oncology

## 2018-04-02 ENCOUNTER — Telehealth: Payer: Self-pay | Admitting: *Deleted

## 2018-04-02 ENCOUNTER — Other Ambulatory Visit: Payer: Self-pay | Admitting: Hematology and Oncology

## 2018-04-02 DIAGNOSIS — Z170 Estrogen receptor positive status [ER+]: Secondary | ICD-10-CM

## 2018-04-02 DIAGNOSIS — C50212 Malignant neoplasm of upper-inner quadrant of left female breast: Secondary | ICD-10-CM

## 2018-04-02 NOTE — Telephone Encounter (Signed)
Ordered oncotype per Dr. Gudena.  Faxed requisition to pathology and confirmed receipt. 

## 2018-04-02 NOTE — Assessment & Plan Note (Signed)
03/26/18: Bil Mastectomies: Right: [place]; Left mastectomy: 3.5 cm Grade 2 mixed IDC and ILC with DCIS, 0/4 LN Neg, ER 100%, PR 100%, Ki-67 3%, HER-2 negative ratio 0.97, T2 N0 stage Ib  Pathology counseling: I discussed the final pathology report of the patient provided  a copy of this report. I discussed the margins as well as lymph node surgeries. We also discussed the final staging along with previously performed ER/PR and HER-2/neu testing.  Plan: 1. Oncotype testing to determine chemo benefit 2. Adj Anti estrogen therapy  RTC based on Oncotype score

## 2018-04-02 NOTE — Progress Notes (Signed)
Patient Care Team: Hoyt Koch, MD as PCP - General (Internal Medicine)  DIAGNOSIS:  Encounter Diagnosis  Name Primary?  . Malignant neoplasm of upper-inner quadrant of left breast in female, estrogen receptor positive ([place])     SUMMARY OF ONCOLOGIC HISTORY:   Malignant neoplasm of upper-inner quadrant of left breast in female, estrogen receptor positive ([place])   01/03/2018 Initial Diagnosis    Patient felt a lump in the right breast which was normal.  In that evaluation she was noted to have left breast UIQ lesion 2.7 cm at 1030 position, axilla normal, biopsy revealed grade 1-2 IDC ER 100%, PR 100%, Ki-67 3%, HER-2 negative ratio 0.97, T2 N0 stage Ib clinical stage AJCC 8      01/24/2018 Genetic Testing    Common Hereditary Cancers Panel + Melanoma Panel (51 genes).  The following genes were evaluated for sequence changes and exonic deletions/duplications: APC, ATM, AXIN2, BAP1, BARD1, BMPR1A, BRCA1, BRCA2, BRIP1, CDH1, CDK4, CDKN2A (p14ARF), CDKN2A (p16INK4a), CHEK2, CTNNA1, DICER1, EPCAM*, GREM1*, KIT, MEN1, MLH1, MSH2, MSH3, MSH6, MUTYH, NBN, NF1, PALB2, PDGFRA, PMS2, POLD1, POLE, POT1, PTEN, RAD50, RAD51C, RAD51D, RB1, SDHB, SDHC, SDHD, SMAD4, SMARCA4, STK11, TP53, TSC1, TSC2, VHL The following genes were evaluated for sequence changes only: HOXB13*, MITF*, NTHL1*, SDHA  Results:  No pathogenic variants were identified.  2 Variants of uncertain significance in the genes ATM c.2786T>C (p.Met929Thr) and BARD1 c.160A>G (p.Thr54Ala) were identified.  The date of this test report is 01/24/2018.       03/26/2018 Surgery    Bil Mastectomies: Right: [place]; Left mastectomy: 3.5 cm Grade 2 mixed IDC and ILC with DCIS, 0/4 LN Neg, ER 100%, PR 100%, Ki-67 3%, HER-2 negative ratio 0.97, T2 N0 stage Ib       CHIEF COMPLIANT: Follow-up of the bilateral mastectomies  INTERVAL HISTORY: Ariel Tran is a 52 year old with above-mentioned history of left breast cancer who underwent bilateral  mastectomies and is here for discussion regarding the pathology report.  She has pain related to the surgery with drains in place.  REVIEW OF SYSTEMS:   Constitutional: Denies fevers, chills or abnormal weight loss Eyes: Denies blurriness of vision Ears, nose, mouth, throat, and face: Denies mucositis or sore throat Respiratory: Denies cough, dyspnea or wheezes Cardiovascular: Denies palpitation, chest discomfort Gastrointestinal:  Denies nausea, heartburn or change in bowel habits Skin: Denies abnormal skin rashes Lymphatics: Denies new lymphadenopathy or easy bruising Neurological:Denies numbness, tingling or new weaknesses Behavioral/Psych: Mood is stable, no new changes  Extremities: No lower extremity edema Breast: Bilateral mastectomies All other systems were reviewed with the patient and are negative.  I have reviewed the past medical history, past surgical history, social history and family history with the patient and they are unchanged from previous note.  ALLERGIES:  is allergic to levothyroxine sodium; chicken allergy; citrus; contrast media [iodinated diagnostic agents]; and eggs or egg-derived products.  MEDICATIONS:  Current Outpatient Medications  Medication Sig Dispense Refill  . amphetamine-dextroamphetamine (ADDERALL) 15 MG tablet Take 1 tablet by mouth 2 (two) times daily.  0  . methocarbamol (ROBAXIN) 500 MG tablet Take 1 tablet (500 mg total) by mouth every 8 (eight) hours as needed for muscle spasms. 30 tablet 0  . Multiple Vitamins-Minerals (MULTIVITAMIN WITH MINERALS) tablet Take 1 tablet by mouth daily.    Marland Kitchen oxyCODONE (ROXICODONE) 5 MG immediate release tablet Take 1-2 tablets (5-10 mg total) by mouth every 4 (four) hours as needed. 40 tablet 0  . sulfamethoxazole-trimethoprim (BACTRIM DS,SEPTRA DS)  800-160 MG tablet Take 1 tablet by mouth 2 (two) times daily. 12 tablet 0  . tamoxifen (NOLVADEX) 10 MG tablet TAKE 1 TABLET (10 MG TOTAL) BY MOUTH DAILY. IF  TOLERATES CAN INCREASE TO 2 TABLETS DAILY (Patient not taking: Reported on 03/11/2018) 60 tablet 0   No current facility-administered medications for this visit.     PHYSICAL EXAMINATION: ECOG PERFORMANCE STATUS: 1 - Symptomatic but completely ambulatory  Vitals:   04/02/18 1032  BP: (!) 130/108  Pulse: 69  Resp: 18  Temp: 98.7 F (37.1 C)  SpO2: 100%   Filed Weights   04/02/18 1032  Weight: 142 lb 9.6 oz (64.7 kg)    GENERAL:alert, no distress and comfortable SKIN: skin color, texture, turgor are normal, no rashes or significant lesions EYES: normal, Conjunctiva are pink and non-injected, sclera clear OROPHARYNX:no exudate, no erythema and lips, buccal mucosa, and tongue normal  NECK: supple, thyroid normal size, non-tender, without nodularity LYMPH:  no palpable lymphadenopathy in the cervical, axillary or inguinal LUNGS: clear to auscultation and percussion with normal breathing effort HEART: regular rate & rhythm and no murmurs and no lower extremity edema ABDOMEN:abdomen soft, non-tender and normal bowel sounds MUSCULOSKELETAL:no cyanosis of digits and no clubbing  NEURO: alert & oriented x 3 with fluent speech, no focal motor/sensory deficits EXTREMITIES: No lower extremity edema  LABORATORY DATA:  I have reviewed the data as listed CMP Latest Ref Rng & Units 01/14/2018 03/12/2017 02/29/2016  Glucose 70 - 140 mg/dL 99 108(H) 95  BUN 7 - 26 mg/dL '14 13 20  ' Creatinine 0.60 - 1.10 mg/dL 0.79 0.68 0.68  Sodium 136 - 145 mmol/L 137 135 138  Potassium 3.5 - 5.1 mmol/L 4.4 4.5 4.0  Chloride 98 - 109 mmol/L 104 104 103  CO2 22 - 29 mmol/L '23 25 28  ' Calcium 8.4 - 10.4 mg/dL 9.7 9.6 9.2  Total Protein 6.4 - 8.3 g/dL 7.7 7.1 6.9  Total Bilirubin 0.2 - 1.2 mg/dL 0.6 1.0 0.8  Alkaline Phos 40 - 150 U/L 78 55 47  AST 5 - 34 U/L '26 18 15  ' ALT 0 - 55 U/L '26 14 11    ' Lab Results  Component Value Date   WBC 5.9 01/14/2018   HGB 13.7 01/14/2018   HCT 40.7 01/14/2018   MCV 86.3  01/14/2018   PLT 228 01/14/2018   NEUTROABS 3.5 01/14/2018    ASSESSMENT & PLAN:  Malignant neoplasm of upper-inner quadrant of left breast in female, estrogen receptor positive ([place]) 03/26/18: Bil Mastectomies: Right: [place]; Left mastectomy: 3.5 cm Grade 2 mixed IDC and ILC with DCIS, 0/4 LN Neg, ER 100%, PR 100%, Ki-67 3%, HER-2 negative ratio 0.97, T2 N0 stage Ib  Pathology counseling: I discussed the final pathology report of the patient provided  a copy of this report. I discussed the margins as well as lymph node surgeries. We also discussed the final staging along with previously performed ER/PR and HER-2/neu testing.  Plan: 1. Oncotype testing to determine chemo benefit 2. Adj Anti estrogen therapy with tamoxifen.  Patient has a prescription for tamoxifen.  I instructed her to wait until we see the Oncotype test results before starting it.  If she goes on tamoxifen then we can follow her in 3 months to assess tolerability.  She will see survivorship clinic at that time.  We will not make any further appointments until received Oncotype score.  RTC based on Oncotype score   No orders of the defined types were  placed in this encounter.  The patient has a good understanding of the overall plan. she agrees with it. she will call with any problems that may develop before the next visit here.   Harriette Ohara, MD 04/02/18

## 2018-04-03 ENCOUNTER — Other Ambulatory Visit: Payer: Self-pay | Admitting: Hematology and Oncology

## 2018-04-04 DIAGNOSIS — Z9013 Acquired absence of bilateral breasts and nipples: Secondary | ICD-10-CM | POA: Insufficient documentation

## 2018-04-12 ENCOUNTER — Other Ambulatory Visit: Payer: Self-pay

## 2018-04-12 DIAGNOSIS — Z170 Estrogen receptor positive status [ER+]: Principal | ICD-10-CM

## 2018-04-12 DIAGNOSIS — C50212 Malignant neoplasm of upper-inner quadrant of left female breast: Secondary | ICD-10-CM

## 2018-04-12 MED ORDER — TAMOXIFEN CITRATE 10 MG PO TABS: 10.0000 mg | ORAL_TABLET | Freq: Every day | ORAL | 0 refills | Status: DC

## 2018-04-15 ENCOUNTER — Telehealth: Payer: Self-pay | Admitting: Adult Health

## 2018-04-15 ENCOUNTER — Telehealth: Payer: Self-pay | Admitting: *Deleted

## 2018-04-15 NOTE — Telephone Encounter (Signed)
Received oncotype score of 17/5%. Physician team notified. Called pt with results. Discussed chemotherapy not recommended based on results. Instructed pt to start on Tamoxifen and will f/u in SCP. Denies further needs at this time.

## 2018-04-15 NOTE — Telephone Encounter (Signed)
Per 7/22 sch msg mailed letter

## 2018-04-17 ENCOUNTER — Encounter (HOSPITAL_COMMUNITY): Payer: Self-pay | Admitting: Hematology and Oncology

## 2018-04-18 ENCOUNTER — Other Ambulatory Visit: Payer: Self-pay

## 2018-04-18 ENCOUNTER — Ambulatory Visit: Payer: BLUE CROSS/BLUE SHIELD | Attending: General Surgery | Admitting: Physical Therapy

## 2018-04-18 ENCOUNTER — Encounter: Payer: Self-pay | Admitting: Physical Therapy

## 2018-04-18 DIAGNOSIS — C50212 Malignant neoplasm of upper-inner quadrant of left female breast: Secondary | ICD-10-CM | POA: Insufficient documentation

## 2018-04-18 DIAGNOSIS — Z483 Aftercare following surgery for neoplasm: Secondary | ICD-10-CM

## 2018-04-18 DIAGNOSIS — Z170 Estrogen receptor positive status [ER+]: Secondary | ICD-10-CM | POA: Diagnosis not present

## 2018-04-18 DIAGNOSIS — M6281 Muscle weakness (generalized): Secondary | ICD-10-CM | POA: Insufficient documentation

## 2018-04-18 DIAGNOSIS — R293 Abnormal posture: Secondary | ICD-10-CM | POA: Insufficient documentation

## 2018-04-18 NOTE — Therapy (Signed)
[place], [place], [address] Phone: [phone]   Fax:  [phone]  Physical Therapy Treatment  Patient Details  Name: Ariel Tran MRN: [id number] Date of Birth: [birth year] Referring Provider: Dr. Stark Klein   Encounter Date: 04/18/2018  PT End of Session - 04/18/18 1155    Visit Number  2    Number of Visits  8    Date for PT Re-Evaluation  05/16/18    PT Start Time  1102    PT Stop Time  1153    PT Time Calculation (min)  51 min    Activity Tolerance  Patient tolerated treatment well    Behavior During Therapy  [facility] for tasks assessed/performed       Past Medical History:  Diagnosis Date  . Allergy   . Breast cancer ([place])   . Complication of anesthesia   . Family history of breast cancer   . Family history of melanoma   . Family history of prostate cancer   . Heart murmur   . Hypothyroidism   . PONV (postoperative nausea and vomiting)   . UTI (urinary tract infection)     Past Surgical History:  Procedure Laterality Date  . BREAST RECONSTRUCTION WITH PLACEMENT OF TISSUE EXPANDER AND ALLODERM Bilateral 03/26/2018   Procedure: BREAST RECONSTRUCTION WITH PLACEMENT OF TISSUE EXPANDER AND ACELLULAR DERMIS;  Surgeon: Irene Limbo, MD;  Location: [facility];  Service: Plastics;  Laterality: Bilateral;  . CESAREAN SECTION    . MASTECTOMY W/ SENTINEL NODE BIOPSY Bilateral 03/26/2018   Procedure: BILATERAL MASTECTOMIES WITH LEFT SENTINEL LYMPH NODE BIOPSY;  Surgeon: Stark Klein, MD;  Location: [facility];  Service: General;  Laterality: Bilateral;  . TUBAL LIGATION      There were no vitals filed for this visit.  Subjective Assessment - 04/18/18 1108    Subjective  Patient underwent a bilateral mastectomy and left sentinel node biopsy (0/4 nodes) on 03/26/18. She had bilateral expanders placed and has her first fill today. No chemo or radiation needed.     Pertinent History  Patient was diagnosed on 01/02/18 with left grade I-II invasive ductal carcinoma breast cancer. It measured 3.5 cm and is located in the upper inner quadrant. It is ER/PR positive and HER2 negative with a Ki67 of 3%. Patient underwent a bilateral mastectomy and left sentinel node biopsy (0/4 nodes) on 03/26/18. She had bilateral expanders placed.    Patient Stated Goals  Get back to normal with my arms.    Currently in Pain?  No/denies         [facility] PT Assessment - 04/18/18 0001      Assessment   Medical Diagnosis  s/p bil mastectomy and left SLNB    Referring Provider  Dr. Stark Klein    Onset Date/Surgical Date  03/26/18    Hand Dominance  Right    Prior Therapy  Baseline assessment      Precautions   Precautions  Other (comment)    Precaution Comments  Left arm lymphedema risk; recent breast reconstruction      Restrictions   Weight Bearing Restrictions  No      Balance Screen   Has the patient fallen in the past 6 months  No    Has the patient had a decrease in activity level because of a fear of falling?   No    Is the patient reluctant to leave their home because of a fear of  falling?   No      Home Environment   Living Environment  Private residence    Living Arrangements  Spouse/significant other;Children Husband and 3 y.o daughter    Available Help at Discharge  Family      Prior Function   Level of Independence  Independent    Vocation  Full time employment    Vocation Requirements  preschool teacher    Leisure  She has not returned to walking yet per MD restrictions      Cognition   Overall Cognitive Status  Within Functional Limits for tasks assessed      Observation/Other Assessments   Observations  Bilateral breast incisions are healing well with glue still present on left breast.      Posture/Postural Control   Posture/Postural Control  Postural limitations    Postural Limitations  Rounded Shoulders;Forward head      ROM / Strength    AROM / PROM / Strength  AROM;Strength      AROM   AROM Assessment Site  Shoulder    Right/Left Shoulder  Right;Left    Right Shoulder Extension  45 Degrees    Right Shoulder Flexion  146 Degrees    Right Shoulder ABduction  152 Degrees    Right Shoulder Internal Rotation  70 Degrees    Right Shoulder External Rotation  89 Degrees    Left Shoulder Extension  41 Degrees    Left Shoulder Flexion  140 Degrees    Left Shoulder ABduction  148 Degrees    Left Shoulder Internal Rotation  68 Degrees    Left Shoulder External Rotation  89 Degrees      Strength   Overall Strength  --    Overall Strength Comments  Left shoulder flexion and abduction 4/5; IR/ER 4/5 with c/o weakness        LYMPHEDEMA/ONCOLOGY QUESTIONNAIRE - 04/18/18 1117      Type   Cancer Type  Left breast cancer      Surgeries   Mastectomy Date  03/26/18    Sentinel Lymph Node Biopsy Date  03/26/18    Number Lymph Nodes Removed  4      Treatment   Active Chemotherapy Treatment  No    Past Chemotherapy Treatment  No    Active Radiation Treatment  No    Past Radiation Treatment  No    Current Hormone Treatment  Yes    Date  04/18/18    Drug Name  Tamoxifen    Past Hormone Therapy  No      What other symptoms do you have   Are you Having Heaviness or Tightness  No    Are you having Pain  No    Are you having pitting edema  No    Is it Hard or Difficult finding clothes that fit  No    Do you have infections  No    Is there Decreased scar mobility  Yes    Stemmer Sign  No      Lymphedema Assessments   Lymphedema Assessments  Upper extremities      Right Upper Extremity Lymphedema   10 cm Proximal to Olecranon Process  26.6 cm    Olecranon Process  23.4 cm    10 cm Proximal to Ulnar Styloid Process  22 cm    Just Proximal to Ulnar Styloid Process  15.2 cm    Across Hand at [employer]  19.4 cm    At [place] of  2nd Digit  6.2 cm      Left Upper Extremity Lymphedema   10 cm Proximal to Olecranon Process   26.8 cm    Olecranon Process  23 cm    10 cm Proximal to Ulnar Styloid Process  20.9 cm    Just Proximal to Ulnar Styloid Process  15.1 cm    Across Hand at [employer]  18.6 cm    At [place] of 2nd Digit  6 cm        Quick Dash - 04/18/18 0001    Open a tight or new jar  No difficulty    Do heavy household chores (wash walls, wash floors)  No difficulty    Carry a shopping bag or briefcase  Mild difficulty    Wash your back  No difficulty    Use a knife to cut food  No difficulty    Recreational activities in which you take some force or impact through your arm, shoulder, or hand (golf, hammering, tennis)  No difficulty    During the past week, to what extent has your arm, shoulder or hand problem interfered with your normal social activities with family, friends, neighbors, or groups?  Not at all    During the past week, to what extent has your arm, shoulder or hand problem limited your work or other regular daily activities  Not at all    Arm, shoulder, or hand pain.  None    Tingling (pins and needles) in your arm, shoulder, or hand  None    Difficulty Sleeping  No difficulty    DASH Score  2.27 %                     PT Education - 04/18/18 1155    Education provided  Yes    Education Details  Lymphedema risk and how to reduce risk; where and how to get a compression sleeve and gauntlet for flying    Person(s) Educated  Patient    Methods  Explanation;Handout    Comprehension  Verbalized understanding          PT Long Term Goals - 04/18/18 1159      PT LONG TERM GOAL #1   Title  Patient will demonstrate she has returned to baseline related to shoulder ROM and function post operatively.    Time  4    Period  Weeks    Status  On-going      PT LONG TERM GOAL #2   Title  Patient will demonstrate she has regained left UE strength and shoulder is 5/5 to perform all normal daily tasks.    Time  4    Period  Weeks    Status  New      PT LONG TERM GOAL  #3   Title  Patient will demonstrate good understanding of a HEP for posture and strength and verbalize a safe self progression.    Time  4    Period  Weeks    Status  New      PT LONG TERM GOAL #4   Title  Patient will verbalize good understanding of the risk reduction practices for lymphedema.    Time  [facility] Goals - 01/09/18 1813      Patient will be able to verbalize understanding of pertinent lymphedema risk reduction practices relevant to her  diagnosis specifically related to skin care.   Time  1    Period  Days    Status  Achieved      Patient will be able to return demonstrate and/or verbalize understanding of the post-op home exercise program related to regaining shoulder range of motion.   Time  1    Period  Days    Status  Achieved      Patient will be able to verbalize understanding of the importance of attending the postoperative After Breast Cancer Class for further lymphedema risk reduction education and therapeutic exercise.   Time  1    Period  Days    Status  Achieved           Plan - 04/18/18 1156    Clinical Impression Statement  Patient is doing very well s/p bilateral mastectomy with left sentinel node biopsy (0/4 nodes positive) on 03/26/18 for left breast cancer. She had expanders placed for reconstruction and will get her first fill today. She does not need chemotherapy or radiation. She has good shoulder ROM and function but some weakness noted with muscle testing in her left UE. She will benefit from a few PT visits to instruct her with arm strengthening and postural exercises as she is prone to rounded shoulders  and now that is more evident with this surgery.    Rehab Potential  Excellent    Clinical Impairments Affecting Rehab Potential  None    PT Frequency  2x / week    PT Duration  3 weeks    PT Treatment/Interventions  ADLs/Self Care Home Management;Therapeutic exercise;Therapeutic  activities;Patient/family education;Manual techniques;Passive range of motion;DME Instruction    PT Next Visit Plan  Strength ABC packet; postural exercises to pull scapulae into retraction    Consulted and Agree with Plan of Care  Patient       Patient will benefit from skilled therapeutic intervention in order to improve the following deficits and impairments:  Decreased knowledge of precautions, Impaired UE functional use, Decreased range of motion, Postural dysfunction, Pain, Decreased strength  Visit Diagnosis: Malignant neoplasm of upper-inner quadrant of left breast in female, estrogen receptor positive ([place]) - Plan: PT plan of care cert/re-cert  Abnormal posture - Plan: PT plan of care cert/re-cert  Muscle weakness (generalized) - Plan: PT plan of care cert/re-cert  Aftercare following surgery for neoplasm - Plan: PT plan of care cert/re-cert     Problem List Patient Active Problem List   Diagnosis Date Noted  . Breast cancer, left breast ([place]) 03/26/2018  . Genetic testing 02/06/2018  . Family history of breast cancer   . Family history of prostate cancer   . Family history of melanoma   . Malignant neoplasm of upper-inner quadrant of left breast in female, estrogen receptor positive ([place]) 01/09/2018  . Cough 11/03/2017  . Hypothyroidism 10/02/2017  . Acute non-recurrent frontal sinusitis 07/26/2017  . Routine general medical examination at a health care facility 11/19/2014  . Chronic fatigue 10/26/2014  . Xerostomia 10/26/2014    Annia Friendly, PT 04/18/18 12:02 PM  [place], [place], [address] Phone: [phone]   Fax:  [phone]  Name: STACHIA SLUTSKY MRN: [id number] Date of Birth: Dec 08, 1965

## 2018-04-29 ENCOUNTER — Ambulatory Visit: Payer: BLUE CROSS/BLUE SHIELD | Attending: General Surgery | Admitting: Rehabilitation

## 2018-04-29 ENCOUNTER — Encounter: Payer: Self-pay | Admitting: Rehabilitation

## 2018-04-29 DIAGNOSIS — M6281 Muscle weakness (generalized): Secondary | ICD-10-CM | POA: Diagnosis not present

## 2018-04-29 DIAGNOSIS — R293 Abnormal posture: Secondary | ICD-10-CM | POA: Diagnosis not present

## 2018-04-29 DIAGNOSIS — C50212 Malignant neoplasm of upper-inner quadrant of left female breast: Secondary | ICD-10-CM | POA: Insufficient documentation

## 2018-04-29 DIAGNOSIS — Z483 Aftercare following surgery for neoplasm: Secondary | ICD-10-CM | POA: Insufficient documentation

## 2018-04-29 DIAGNOSIS — Z170 Estrogen receptor positive status [ER+]: Secondary | ICD-10-CM

## 2018-04-29 NOTE — Therapy (Addendum)
[place], [place], [address] Phone: [phone]   Fax:  [phone]  Physical Therapy Treatment  Patient Details  Name: Ariel Tran MRN: [id number] Date of Birth: [birth year] Referring Provider: Dr. Stark Klein   Encounter Date: 04/29/2018  PT End of Session - 04/29/18 0928    Visit Number  3    Date for PT Re-Evaluation  05/16/18    PT Start Time  0847    PT Stop Time  0927    PT Time Calculation (min)  40 min    Activity Tolerance  Patient tolerated treatment well    Behavior During Therapy  [facility] for tasks assessed/performed       Past Medical History:  Diagnosis Date  . Allergy   . Breast cancer ([place])   . Complication of anesthesia   . Family history of breast cancer   . Family history of melanoma   . Family history of prostate cancer   . Heart murmur   . Hypothyroidism   . PONV (postoperative nausea and vomiting)   . UTI (urinary tract infection)     Past Surgical History:  Procedure Laterality Date  . BREAST RECONSTRUCTION WITH PLACEMENT OF TISSUE EXPANDER AND ALLODERM Bilateral 03/26/2018   Procedure: BREAST RECONSTRUCTION WITH PLACEMENT OF TISSUE EXPANDER AND ACELLULAR DERMIS;  Surgeon: Irene Limbo, MD;  Location: [facility];  Service: Plastics;  Laterality: Bilateral;  . CESAREAN SECTION    . MASTECTOMY W/ SENTINEL NODE BIOPSY Bilateral 03/26/2018   Procedure: BILATERAL MASTECTOMIES WITH LEFT SENTINEL LYMPH NODE BIOPSY;  Surgeon: Stark Klein, MD;  Location: [facility];  Service: General;  Laterality: Bilateral;  . TUBAL LIGATION      There were no vitals filed for this visit.  Subjective Assessment - 04/29/18 0853    Subjective  Expanders are now full, trying to figure out when to do the reconstruction.  Still stretching at home .  Tried to run yesterday and it hurt too much.  Would like to just learn strength ABC today and be done    Pertinent  History  Patient was diagnosed on 01/02/18 with left grade I-II invasive ductal carcinoma breast cancer. It measured 3.5 cm and is located in the upper inner quadrant. It is ER/PR positive and HER2 negative with a Ki67 of 3%. Patient underwent a bilateral mastectomy and left sentinel node biopsy (0/4 nodes) on 03/26/18. She had bilateral expanders placed.    Currently in Pain?  No/denies                       [facility] Adult PT Treatment/Exercise - 04/29/18 0001      Exercises   Exercises  Other Exercises    Other Exercises   performed strength ABC program with education on selecting weights for the UEs, increasing weight, and using compression if needed.  Performed 3-5 of each UE weight exercise, 1 of each stretch, and demo only of the core exercises                  PT Long Term Goals - 04/29/18 0929      PT LONG TERM GOAL #1   Title  Patient will demonstrate she has returned to baseline related to shoulder ROM and function post operatively.    Status  Partially Met      PT LONG TERM GOAL #2   Title  Patient will demonstrate she has regained left UE  strength and shoulder is 5/5 to perform all normal daily tasks.    Status  On-going      PT LONG TERM GOAL #3   Title  Patient will demonstrate good understanding of a HEP for posture and strength and verbalize a safe self progression.    Status  Achieved      PT LONG TERM GOAL #4   Title  Patient will verbalize good understanding of the risk reduction practices for lymphedema.    Status  Achieved      [facility] Goals - 01/09/18 1813      Patient will be able to verbalize understanding of pertinent lymphedema risk reduction practices relevant to her diagnosis specifically related to skin care.   Time  1    Period  Days    Status  Achieved      Patient will be able to return demonstrate and/or verbalize understanding of the post-op home exercise program related to regaining shoulder range of motion.   Time   1    Period  Days    Status  Achieved      Patient will be able to verbalize understanding of the importance of attending the postoperative After Breast Cancer Class for further lymphedema risk reduction education and therapeutic exercise.   Time  1    Period  Days    Status  Achieved           Plan - 04/29/18 6701    Clinical Impression Statement  Pt continues to do well and is interested in 1 more visit to learn strength ABC program.  She feels good with program after education on each exercise.  Good knowledge of compression use, and how to increase weight.  Pt aware she can return as needed for followup    PT Frequency  2x / week    PT Duration  3 weeks    PT Treatment/Interventions  ADLs/Self Care Home Management;Therapeutic exercise;Therapeutic activities;Patient/family education;Manual techniques;Passive range of motion;DME Instruction    PT Home Exercise Plan  Post op shoulder ROM HEP, given strength ABC program 8/5       Patient will benefit from skilled therapeutic intervention in order to improve the following deficits and impairments:  Decreased knowledge of precautions, Impaired UE functional use, Decreased range of motion, Postural dysfunction, Pain, Decreased strength  Visit Diagnosis: Malignant neoplasm of upper-inner quadrant of left breast in female, estrogen receptor positive ([facility])  Abnormal posture  Muscle weakness (generalized)  Aftercare following surgery for neoplasm     Problem List Patient Active Problem List   Diagnosis Date Noted  . Breast cancer, left breast ([place]) 03/26/2018  . Genetic testing 02/06/2018  . Family history of breast cancer   . Family history of prostate cancer   . Family history of melanoma   . Malignant neoplasm of upper-inner quadrant of left breast in female, estrogen receptor positive ([place]) 01/09/2018  . Cough 11/03/2017  . Hypothyroidism 10/02/2017  . Acute non-recurrent frontal sinusitis 07/26/2017  . Routine general  medical examination at a health care facility 11/19/2014  . Chronic fatigue 10/26/2014  . Xerostomia 10/26/2014    Shan Levans, PT 04/29/2018, 9:30 AM  [place], [place], [address] Phone: [phone]   Fax:  [phone]  Name: Ariel Tran MRN: [id number] Date of Birth: 19-Jul-1966  PHYSICAL THERAPY DISCHARGE SUMMARY  Visits from Start of Care: 3 Current functional level related to goals / functional outcomes: See above  Remaining deficits: See above   Education / Equipment: Given HEP for stretching and strengthening  Plan: Patient agrees to discharge.  Patient goals were met. Patient is being discharged due to being pleased with the current functional level.  ?????    Shan Levans, PT

## 2018-05-01 ENCOUNTER — Encounter: Payer: BLUE CROSS/BLUE SHIELD | Admitting: Rehabilitation

## 2018-06-10 DIAGNOSIS — F900 Attention-deficit hyperactivity disorder, predominantly inattentive type: Secondary | ICD-10-CM

## 2018-06-27 DIAGNOSIS — Z9013 Acquired absence of bilateral breasts and nipples: Secondary | ICD-10-CM | POA: Diagnosis not present

## 2018-06-27 DIAGNOSIS — Z853 Personal history of malignant neoplasm of breast: Secondary | ICD-10-CM | POA: Diagnosis not present

## 2018-07-01 ENCOUNTER — Ambulatory Visit: Payer: Self-pay | Admitting: Physician Assistant

## 2018-07-19 ENCOUNTER — Inpatient Hospital Stay: Payer: BLUE CROSS/BLUE SHIELD | Attending: Adult Health | Admitting: Adult Health

## 2018-07-19 ENCOUNTER — Encounter: Payer: Self-pay | Admitting: Adult Health

## 2018-07-19 ENCOUNTER — Telehealth: Payer: Self-pay | Admitting: Adult Health

## 2018-07-19 VITALS — BP 134/74 | HR 87 | Temp 98.7°F | Resp 18 | Ht 64.0 in | Wt 136.5 lb

## 2018-07-19 DIAGNOSIS — Z170 Estrogen receptor positive status [ER+]: Secondary | ICD-10-CM

## 2018-07-19 DIAGNOSIS — C50212 Malignant neoplasm of upper-inner quadrant of left female breast: Secondary | ICD-10-CM | POA: Insufficient documentation

## 2018-07-19 DIAGNOSIS — Z79810 Long term (current) use of selective estrogen receptor modulators (SERMs): Secondary | ICD-10-CM | POA: Diagnosis not present

## 2018-07-19 DIAGNOSIS — Z9013 Acquired absence of bilateral breasts and nipples: Secondary | ICD-10-CM | POA: Diagnosis not present

## 2018-07-19 NOTE — Telephone Encounter (Signed)
Gave pt avs and calendar  °

## 2018-07-19 NOTE — Progress Notes (Signed)
CLINIC:  Survivorship   REASON FOR VISIT:  Routine follow-up post-treatment for a recent history of breast cancer.  BRIEF ONCOLOGIC HISTORY:    Malignant neoplasm of upper-inner quadrant of left breast in female, estrogen receptor positive ([place])   01/03/2018 Initial Diagnosis    Patient felt a lump in the right breast which was normal.  In that evaluation she was noted to have left breast UIQ lesion 2.7 cm at 1030 position, axilla normal, biopsy revealed grade 1-2 IDC ER 100%, PR 100%, Ki-67 3%, HER-2 negative ratio 0.97, T2 N0 stage Ib clinical stage AJCC 8    01/24/2018 Genetic Testing    Common Hereditary Cancers Panel + Melanoma Panel (51 genes).  The following genes were evaluated for sequence changes and exonic deletions/duplications: APC, ATM, AXIN2, BAP1, BARD1, BMPR1A, BRCA1, BRCA2, BRIP1, CDH1, CDK4, CDKN2A (p14ARF), CDKN2A (p16INK4a), CHEK2, CTNNA1, DICER1, EPCAM*, GREM1*, KIT, MEN1, MLH1, MSH2, MSH3, MSH6, MUTYH, NBN, NF1, PALB2, PDGFRA, PMS2, POLD1, POLE, POT1, PTEN, RAD50, RAD51C, RAD51D, RB1, SDHB, SDHC, SDHD, SMAD4, SMARCA4, STK11, TP53, TSC1, TSC2, VHL The following genes were evaluated for sequence changes only: HOXB13*, MITF*, NTHL1*, SDHA  Results:  No pathogenic variants were identified.  2 Variants of uncertain significance in the genes ATM c.2786T>C (p.Met929Thr) and BARD1 c.160A>G (p.Thr54Ala) were identified.  The date of this test report is 01/24/2018.     03/26/2018 Surgery    Bil Mastectomies: Right: PASH; Left mastectomy: 3.5 cm Grade 2 mixed IDC and ILC with DCIS, 0/4 LN Neg, ER 100%, PR 100%, Ki-67 3%, HER-2 negative ratio 0.97, T2 N0 stage Ib    03/26/2018 Oncotype testing    17/5%    04/12/2018 -  Anti-estrogen oral therapy    Tamoxifen daily     INTERVAL HISTORY:  Ariel Tran presents to the [facility] today for our initial meeting to review her survivorship care plan detailing her treatment course for breast cancer, as well as monitoring long-term side  effects of that treatment, education regarding health maintenance, screening, and overall wellness and health promotion.     Overall, Ariel Tran reports feeling moderately well.  She tried taking Tamoxifen and was nauseated and felt poorly every day.  She had joint aches and increased fatigue, and heavy menstrual.  She still has menstrual cycles, and typically these are once per month.  This was at the 85m per day dose.      REVIEW OF SYSTEMS:  Review of Systems  Constitutional: Negative for appetite change, chills, fatigue, fever and unexpected weight change.  HENT:   Negative for hearing loss, lump/mass and trouble swallowing.   Eyes: Negative for eye problems and icterus.  Respiratory: Negative for chest tightness, cough and shortness of breath.   Cardiovascular: Negative for chest pain, leg swelling and palpitations.  Gastrointestinal: Negative for abdominal distention, abdominal pain, constipation, diarrhea, nausea and vomiting.  Endocrine: Negative for hot flashes.  Skin: Negative for itching and rash.  Neurological: Negative for dizziness, headaches and numbness.  Hematological: Negative for adenopathy. Does not bruise/bleed easily.  Psychiatric/Behavioral: Negative for depression. The patient is not nervous/anxious.    Breast: Denies any new nodularity, masses, tenderness, nipple changes, or nipple discharge.      ONCOLOGY TREATMENT TEAM:  1. Sur[facility]  [facility] 2. Medical Oncologist: Dr. GLindi Adie 3. Plastic Surgeon: Dr. TIran Planas   PAST MEDICAL/SURGICAL HISTORY:  Past Medical History:  Diagnosis Date  . Allergy   . [place]   . Complication of  anesthesia   . Family history of breast cancer   . Family history of melanoma   . Family history of prostate cancer   . Heart murmur   . Hypothyroidism   . PONV (postoperative nausea and vomiting)   . UTI (urinary tract infection)    Past Surgical History:  Procedure Laterality Date    . BREAST RECONSTRUCTION WITH PLACEMENT OF TISSUE EXPANDER AND ALLODERM Bilateral 03/26/2018   Procedure: BREAST RECONSTRUCTION WITH PLACEMENT OF TISSUE EXPANDER AND ACELLULAR DERMIS;  Surgeon: Irene Limbo, MD;  Location: [facility];  Service: Plastics;  Laterality: Bilateral;  . CESAREAN SECTION    . MASTECTOMY W/ SENTINEL NODE BIOPSY Bilateral 03/26/2018   Procedure: BILATERAL MASTECTOMIES WITH LEFT SENTINEL LYMPH NODE BIOPSY;  Surgeon: Stark Klein, MD;  Location: [facility];  Service: General;  Laterality: Bilateral;  . TUBAL LIGATION       ALLERGIES:  Allergies  Allergen Reactions  . Levothyroxine Sodium Itching and Rash  . Chicken Allergy   . Citrus   . Contrast Media [Iodinated Diagnostic Agents]   . Eggs Or Egg-Derived Products   . Tramadol Nausea Only    dizziness     CURRENT MEDICATIONS:  Outpatient Encounter Medications as of 07/19/2018  Medication Sig Note  . amphetamine-dextroamphetamine (ADDERALL) 15 MG tablet Take 1 tablet by mouth 2 (two) times daily. 10/26/2014: Received from: External Pharmacy  . Multiple Vitamins-Minerals (MULTIVITAMIN WITH MINERALS) tablet Take 1 tablet by mouth daily.   . tamoxifen (NOLVADEX) 10 MG tablet Take 1 tablet (10 mg total) by mouth daily. If tolerates can increase to 2 tablets daily   . [DISCONTINUED] methocarbamol (ROBAXIN) 500 MG tablet Take 1 tablet (500 mg total) by mouth every 8 (eight) hours as needed for muscle spasms. (Patient not taking: Reported on 07/19/2018)   . [DISCONTINUED] oxyCODONE (ROXICODONE) 5 MG immediate release tablet Take 1-2 tablets (5-10 mg total) by mouth every 4 (four) hours as needed. (Patient not taking: Reported on 07/19/2018)   . [DISCONTINUED] sulfamethoxazole-trimethoprim (BACTRIM DS,SEPTRA DS) 800-160 MG tablet Take 1 tablet by mouth 2 (two) times daily. (Patient not taking: Reported on 07/19/2018)    No facility-administered encounter medications on file as of  07/19/2018.      ONCOLOGIC FAMILY HISTORY:  Family History  Problem Relation Age of Onset  . Cancer Mother        lung  . Hyperlipidemia Mother   . Hypothyroidism Mother   . Thyroid disease Mother   . Breast cancer Mother        dx late 66's early 21's  . Prostate cancer Father 58       metastatic  . Diabetes Maternal Uncle   . Thyroid disease Maternal Uncle   . Cancer Paternal Grandmother        abdominal area  . Diabetes Paternal Grandmother   . Bleeding Disorder Sister   . Sjogren's syndrome Sister   . Thyroid disease Maternal Aunt   . Thyroid disease Maternal Grandmother   . Thyroid disease Maternal Grandfather   . Brain cancer Paternal Aunt 53  . Cancer Paternal Grandfather 36       metastatic to bone, thinks primary may have been prostate  . Melanoma Cousin   . Thyroid cancer Cousin        had thyroid disease prior  . Lung cancer Cousin      GENETIC COUNSELING/TESTING: See above  SOCIAL HISTORY:  Social History   Socioeconomic History  . Marital status: Married  Spouse name: Not on file  . Number of children: Not on file  . Years of education: Not on file  . Highest education level: Not on file  Occupational History  . Not on file  Social Needs  . Financial resource strain: Not on file  . Food insecurity:    Worry: Not on file    Inability: Not on file  . Transportation needs:    Medical: Not on file    Non-medical: Not on file  Tobacco Use  . Smoking status: Never Smoker  . Smokeless tobacco: Never Used  Substance and Sexual Activity  . Alcohol use: Yes    Alcohol/week: 0.0 standard drinks    Comment: social  . Drug use: Never  . Sexual activity: Yes    Birth control/protection: None, Surgical    Comment: tubal ligation  Lifestyle  . Physical activity:    Days per week: Not on file    Minutes per session: Not on file  . Stress: Not on file  Relationships  . Social connections:    Talks on phone: Not on file    Gets together: Not on  file    Attends religious service: Not on file    Active member of club or organization: Not on file    Attends meetings of clubs or organizations: Not on file    Relationship status: Not on file  . Intimate partner violence:    Fear of current or ex partner: Not on file    Emotionally abused: Not on file    Physically abused: Not on file    Forced sexual activity: Not on file  Other Topics Concern  . Not on file  Social History Narrative  . Not on file      PHYSICAL EXAMINATION:  Vital Signs:   Vitals:   07/19/18 1352  BP: 134/74  Pulse: 87  Resp: 18  Temp: 98.7 F (37.1 C)  SpO2: 100%   Filed Weights   07/19/18 1352  Weight: 136 lb 8 oz (61.9 kg)   General: Well-nourished, well-appearing female in no acute distress.  She is unaccompanied today.   HEENT: Head is normocephalic.  Pupils equal and reactive to light. Conjunctivae clear without exudate.  Sclerae anicteric. Oral mucosa is pink, moist.  Oropharynx is pink without lesions or erythema.  Lymph: No cervical, supraclavicular, or infraclavicular lymphadenopathy noted on palpation.  Cardiovascular: Regular rate and rhythm.Marland Kitchen Respiratory: Clear to auscultation bilaterally. Chest expansion symmetric; breathing non-labored.  Breast exam: s/p bilateral mastectomies with expanders in place, healing well, no sign of local recurrence GI: Abdomen soft and round; non-tender, non-distended. Bowel sounds normoactive.  GU: Deferred.  Neuro: No focal deficits. Steady gait.  Psych: Mood and affect normal and appropriate for situation.  Extremities: No edema. MSK: No focal spinal tenderness to palpation.  Full range of motion in bilateral upper extremities Skin: Warm and dry.  LABORATORY DATA:  None for this visit.  DIAGNOSTIC IMAGING:  None for this visit.      ASSESSMENT AND PLAN:  Ms.. Tran is a pleasant 52 y.o. female with Stage IA left breast invasive ductal carcinoma, ER+/PR+/HER2-, diagnosed in 02/2018, treated with  bilateral mastectomies, unable to tolerate Tamoxifen.  She presents to the Survivorship Clinic for our initial meeting and routine follow-up post-completion of treatment for breast cancer.    1. Stage IA left breast cancer:  Ariel Tran is continuing to recover from definitive treatment for breast cancer. She will follow-up with her medical oncologist, Dr.  Gudena in 3 months with history and physical exam per surveillance protocol. Ariel Tran has had a hard time taking Tamoxifen.  Due to this we reviewed the possibility of Zoladex and Anastrozole instead.  I gave her paperwork and encouraged her to think about it.  She plans on reviewing with her son who is a Software engineer.  We reviewed her risk should she not take the anti estrogen therapy and she understands this.     Today, a comprehensive survivorship care plan and treatment summary was reviewed with the patient today detailing her breast cancer diagnosis, treatment course, potential late/long-term effects of treatment, appropriate follow-up care with recommendations for the future, and patient education resources.  A copy of this summary, along with a letter will be sent to the patient's primary care provider via mail/fax/In Basket message after today's visit.    2. Bone health:  Given Ariel Tran's age/history of breast cancer, she is at slight risk for bone demineralization.She knows that if she starts anastrozole she will need to have regular bone density testing.  She was given education on specific activities to promote bone health.  3. Cancer screening:  Due to Ariel Tran's history and her age, she should receive screening for skin cancers, colon cancer, and gynecologic cancers.  The information and recommendations are listed on the patient's comprehensive care plan/treatment summary and were reviewed in detail with the patient.    4. Health maintenance and wellness promotion: Ariel Tran was encouraged to consume 5-7 servings of fruits and vegetables per day.  We reviewed the "Nutrition Rainbow" handout, as well as the handout "Take Control of Your Health and Reduce Your Cancer Risk" from the [facility].  She was also encouraged to engage in moderate to vigorous exercise for 30 minutes per day most days of the week. We discussed the [employer] fitness program, which is designed for cancer survivors to help them become more physically fit after cancer treatments.  She was instructed to limit her alcohol consumption and continue to abstain from tobacco use.     5. Support services/counseling: It is not uncommon for this period of the patient's cancer care trajectory to be one of many emotions and stressors.  We discussed an opportunity for her to participate in the next session of [facility] ("Finding Your New Normal") support group series designed for patients after they have completed treatment.   Ariel Tran was encouraged to take advantage of our many other support services programs, support groups, and/or counseling in coping with her new life as a cancer survivor after completing anti-cancer treatment.  She was offered support today through active listening and expressive supportive counseling.  She was given information regarding our available services and encouraged to contact me with any questions or for help enrolling in any of our support group/programs.    Dispo:   -Return to cancer center in 3 months for f/u with Dr. Lindi Adie  -Mammogram n/a since s/p bilateral mastectomy -She is welcome to return back to the Survivorship Clinic at any time; no additional follow-up needed at this time.  -Consider referral back to survivorship as a long-term survivor for continued surveillance  A total of (30) minutes of face-to-face time was spent with this patient with greater than 50% of that time in counseling and care-coordination.   Gardenia Phlegm, [place] [phone]   Note: PRIMARY CARE  PROVIDER Hoyt Koch, [place] [phone]

## 2018-07-19 NOTE — Patient Instructions (Signed)
Goserelin injection What is this medicine? GOSERELIN (GOE se rel in) is similar to a hormone found in the body. It lowers the amount of sex hormones that the body makes. Men will have lower testosterone levels and women will have lower estrogen levels while taking this medicine. In men, this medicine is used to treat prostate cancer; the injection is either given once per month or once every 12 weeks. A once per month injection (only) is used to treat women with endometriosis, dysfunctional uterine bleeding, or advanced breast cancer. This medicine may be used for other purposes; ask your health care provider or pharmacist if you have questions. COMMON BRAND NAME(S): Zoladex What should I tell my health care provider before I take this medicine? They need to know if you have any of these conditions (some only apply to women): -diabetes -heart disease or previous heart attack -high blood pressure -high cholesterol -kidney disease -osteoporosis or low bone density -problems passing urine -spinal cord injury -stroke -tobacco smoker -an unusual or allergic reaction to goserelin, hormone therapy, other medicines, foods, dyes, or preservatives -pregnant or trying to get pregnant -breast-feeding How should I use this medicine? This medicine is for injection under the skin. It is given by a health care professional in a hospital or clinic setting. Men receive this injection once every 4 weeks or once every 12 weeks. Women will only receive the once every 4 weeks injection. Talk to your pediatrician regarding the use of this medicine in children. Special care may be needed. Overdosage: If you think you have taken too much of this medicine contact a poison control center or emergency room at once. NOTE: This medicine is only for you. Do not share this medicine with others. What if I miss a dose? It is important not to miss your dose. Call your doctor or health care professional if you are unable to  keep an appointment. What may interact with this medicine? -female hormones like estrogen -herbal or dietary supplements like black cohosh, chasteberry, or DHEA -female hormones like testosterone -prasterone This list may not describe all possible interactions. Give your health care provider a list of all the medicines, herbs, non-prescription drugs, or dietary supplements you use. Also tell them if you smoke, drink alcohol, or use illegal drugs. Some items may interact with your medicine. What should I watch for while using this medicine? Visit your doctor or health care professional for regular checks on your progress. Your symptoms may appear to get worse during the first weeks of this therapy. Tell your doctor or healthcare professional if your symptoms do not start to get better or if they get worse after this time. Your bones may get weaker if you take this medicine for a long time. If you smoke or frequently drink alcohol you may increase your risk of bone loss. A family history of osteoporosis, chronic use of drugs for seizures (convulsions), or corticosteroids can also increase your risk of bone loss. Talk to your doctor about how to keep your bones strong. This medicine should stop regular monthly menstration in women. Tell your doctor if you continue to menstrate. Women should not become pregnant while taking this medicine or for 12 weeks after stopping this medicine. Women should inform their doctor if they wish to become pregnant or think they might be pregnant. There is a potential for serious side effects to an unborn child. Talk to your health care professional or pharmacist for more information. Do not breast-feed an infant while taking   this medicine. Men should inform their doctors if they wish to father a child. This medicine may lower sperm counts. Talk to your health care professional or pharmacist for more information. What side effects may I notice from receiving this  medicine? Side effects that you should report to your doctor or health care professional as soon as possible: -allergic reactions like skin rash, itching or hives, swelling of the face, lips, or tongue -bone pain -breathing problems -changes in vision -chest pain -feeling faint or lightheaded, falls -fever, chills -pain, swelling, warmth in the leg -pain, tingling, numbness in the hands or feet -signs and symptoms of low blood pressure like dizziness; feeling faint or lightheaded, falls; unusually weak or tired -stomach pain -swelling of the ankles, feet, hands -trouble passing urine or change in the amount of urine -unusually high or low blood pressure -unusually weak or tired Side effects that usually do not require medical attention (report to your doctor or health care professional if they continue or are bothersome): -change in sex drive or performance -changes in breast size in both males and females -changes in emotions or moods -headache -hot flashes -irritation at site where injected -loss of appetite -skin problems like acne, dry skin -vaginal dryness This list may not describe all possible side effects. Call your doctor for medical advice about side effects. You may report side effects to FDA at 1-800-FDA-1088. Where should I keep my medicine? This drug is given in a hospital or clinic and will not be stored at home. NOTE: This sheet is a summary. It may not cover all possible information. If you have questions about this medicine, talk to your doctor, pharmacist, or health care provider.  2018 Elsevier/Gold Standard (2013-11-18 11:10:35) Anastrozole tablets What is this medicine? ANASTROZOLE (an AS troe zole) is used to treat breast cancer in women who have gone through menopause. Some types of breast cancer depend on estrogen to grow, and this medicine can stop tumor growth by blocking estrogen production. This medicine may be used for other purposes; ask your health  care provider or pharmacist if you have questions. COMMON BRAND NAME(S): Arimidex What should I tell my health care provider before I take this medicine? They need to know if you have any of these conditions: -liver disease -an unusual or allergic reaction to anastrozole, other medicines, foods, dyes, or preservatives -pregnant or trying to get pregnant -breast-feeding How should I use this medicine? Take this medicine by mouth with a glass of water. Follow the directions on the prescription label. You can take this medicine with or without food. Take your doses at regular intervals. Do not take your medicine more often than directed. Do not stop taking except on the advice of your doctor or health care professional. Talk to your pediatrician regarding the use of this medicine in children. Special care may be needed. Overdosage: If you think you have taken too much of this medicine contact a poison control center or emergency room at once. NOTE: This medicine is only for you. Do not share this medicine with others. What if I miss a dose? If you miss a dose, take it as soon as you can. If it is almost time for your next dose, take only that dose. Do not take double or extra doses. What may interact with this medicine? Do not take this medicine with any of the following medications: -female hormones, like estrogens or progestins and birth control pills This medicine may also interact with the following medications: -  tamoxifen This list may not describe all possible interactions. Give your health care provider a list of all the medicines, herbs, non-prescription drugs, or dietary supplements you use. Also tell them if you smoke, drink alcohol, or use illegal drugs. Some items may interact with your medicine. What should I watch for while using this medicine? Visit your doctor or health care professional for regular checks on your progress. Let your doctor or health care professional know about any  unusual vaginal bleeding. Do not treat yourself for diarrhea, nausea, vomiting or other side effects. Ask your doctor or health care professional for advice. What side effects may I notice from receiving this medicine? Side effects that you should report to your doctor or health care professional as soon as possible: -allergic reactions like skin rash, itching or hives, swelling of the face, lips, or tongue -any new or unusual symptoms -breathing problems -chest pain -leg pain or swelling -vomiting Side effects that usually do not require medical attention (report to your doctor or health care professional if they continue or are bothersome): -back or bone pain -cough, or throat infection -diarrhea or constipation -dizziness -headache -hot flashes -loss of appetite -nausea -sweating -weakness and tiredness -weight gain This list may not describe all possible side effects. Call your doctor for medical advice about side effects. You may report side effects to FDA at 1-800-FDA-1088. Where should I keep my medicine? Keep out of the reach of children. Store at room temperature between 20 and 25 degrees C (68 and 77 degrees F). Throw away any unused medicine after the expiration date. NOTE: This sheet is a summary. It may not cover all possible information. If you have questions about this medicine, talk to your doctor, pharmacist, or health care provider.  2018 Elsevier/Gold Standard (2007-11-22 16:31:52)

## 2018-07-23 DIAGNOSIS — F901 Attention-deficit hyperactivity disorder, predominantly hyperactive type: Secondary | ICD-10-CM

## 2018-08-06 ENCOUNTER — Ambulatory Visit: Payer: Self-pay | Admitting: Physician Assistant

## 2018-08-12 ENCOUNTER — Telehealth: Payer: Self-pay | Admitting: Physician Assistant

## 2018-08-12 ENCOUNTER — Other Ambulatory Visit: Payer: Self-pay | Admitting: Physician Assistant

## 2018-08-12 ENCOUNTER — Encounter (HOSPITAL_BASED_OUTPATIENT_CLINIC_OR_DEPARTMENT_OTHER): Payer: Self-pay | Admitting: *Deleted

## 2018-08-12 ENCOUNTER — Other Ambulatory Visit: Payer: Self-pay

## 2018-08-12 NOTE — Telephone Encounter (Signed)
I need her pharmacy name and location so I can send it in please.

## 2018-08-12 NOTE — Telephone Encounter (Signed)
Requesting a prescription for Adderall. Next  Appt. 12/02

## 2018-08-13 ENCOUNTER — Other Ambulatory Visit: Payer: Self-pay | Admitting: Physician Assistant

## 2018-08-13 MED ORDER — AMPHETAMINE-DEXTROAMPHETAMINE 15 MG PO TABS: 15.0000 mg | ORAL_TABLET | Freq: Two times a day (BID) | ORAL | 0 refills | Status: DC

## 2018-08-16 ENCOUNTER — Ambulatory Visit (INDEPENDENT_AMBULATORY_CARE_PROVIDER_SITE_OTHER): Payer: BLUE CROSS/BLUE SHIELD | Admitting: Family

## 2018-08-16 ENCOUNTER — Other Ambulatory Visit: Payer: BLUE CROSS/BLUE SHIELD

## 2018-08-16 ENCOUNTER — Encounter: Payer: Self-pay | Admitting: Family

## 2018-08-16 VITALS — BP 126/72 | HR 83 | Temp 97.7°F | Ht 64.0 in | Wt 137.1 lb

## 2018-08-16 DIAGNOSIS — R300 Dysuria: Secondary | ICD-10-CM

## 2018-08-16 LAB — POC URINALSYSI DIPSTICK (AUTOMATED)
Bilirubin, UA: NEGATIVE
Blood, UA: NEGATIVE
Glucose, UA: NEGATIVE
KETONES UA: NEGATIVE
Leukocytes, UA: NEGATIVE
Nitrite, UA: NEGATIVE
PROTEIN UA: NEGATIVE
SPEC GRAV UA: 1.015 (ref 1.010–1.025)
UROBILINOGEN UA: 0.2 U/dL
pH, UA: 8 (ref 5.0–8.0)

## 2018-08-16 MED ORDER — CIPROFLOXACIN HCL 250 MG PO TABS: 250.0000 mg | ORAL_TABLET | Freq: Two times a day (BID) | ORAL | 0 refills | Status: DC

## 2018-08-16 NOTE — Progress Notes (Signed)
MARIT GOODWILL is a 52 y.o. female with the following history as recorded in EpicCare:  Patient Active Problem List   Diagnosis Date Noted  . Hyperkinetic conduct disorder of childhood 07/23/2018  . ADHD, predominantly inattentive type 06/10/2018  . Acquired absence of breast and nipple, bilateral 04/04/2018  . Breast cancer, left breast ([place]) 03/26/2018  . Genetic testing 02/06/2018  . Family history of breast cancer   . Family history of prostate cancer   . Family history of melanoma   . Malignant neoplasm of upper-inner quadrant of left breast in female, estrogen receptor positive ([place]) 01/09/2018  . Cough 11/03/2017  . Hypothyroidism 10/02/2017  . Acute non-recurrent frontal sinusitis 07/26/2017  . Routine general medical examination at a health care facility 11/19/2014  . Chronic fatigue 10/26/2014  . Xerostomia 10/26/2014    Current Outpatient Medications  Medication Sig Dispense Refill  . amphetamine-dextroamphetamine (ADDERALL) 15 MG tablet Take 1 tablet by mouth 2 (two) times daily.  0  . amphetamine-dextroamphetamine (ADDERALL) 15 MG tablet Take 1 tablet by mouth 2 (two) times daily. 60 tablet 0  . Multiple Vitamins-Minerals (MULTIVITAMIN WITH MINERALS) tablet Take 1 tablet by mouth daily.    . tamoxifen (NOLVADEX) 10 MG tablet Take 1 tablet (10 mg total) by mouth daily. If tolerates can increase to 2 tablets daily 60 tablet 0   No current facility-administered medications for this visit.     Allergies: Levothyroxine sodium; Chicken allergy; Citrus; Contrast media [iodinated diagnostic agents]; Eggs or egg-derived products; and Tramadol  Past Medical History:  Diagnosis Date  . Allergy   . Breast cancer ([place]) 01/2018   left breast cancer  . Family history of breast cancer   . Family history of melanoma   . Family history of prostate cancer   . Heart murmur   . Hypothyroidism   . UTI (urinary tract infection)     Past Surgical History:  Procedure Laterality Date   . BREAST RECONSTRUCTION WITH PLACEMENT OF TISSUE EXPANDER AND ALLODERM Bilateral 03/26/2018   Procedure: BREAST RECONSTRUCTION WITH PLACEMENT OF TISSUE EXPANDER AND ACELLULAR DERMIS;  Surgeon: Irene Limbo, MD;  Location: [facility];  Service: Plastics;  Laterality: Bilateral;  . CESAREAN SECTION    . MASTECTOMY W/ SENTINEL NODE BIOPSY Bilateral 03/26/2018   Procedure: BILATERAL MASTECTOMIES WITH LEFT SENTINEL LYMPH NODE BIOPSY;  Surgeon: Stark Klein, MD;  Location: [facility];  Service: General;  Laterality: Bilateral;  . TUBAL LIGATION      Family History  Problem Relation Age of Onset  . Cancer Mother        lung  . Hyperlipidemia Mother   . Hypothyroidism Mother   . Thyroid disease Mother   . Breast cancer Mother        dx late 20's early 69's  . Prostate cancer Father 42       metastatic  . Diabetes Maternal Uncle   . Thyroid disease Maternal Uncle   . Cancer Paternal Grandmother        abdominal area  . Diabetes Paternal Grandmother   . Bleeding Disorder Sister   . Sjogren's syndrome Sister   . Thyroid disease Maternal Aunt   . Thyroid disease Maternal Grandmother   . Thyroid disease Maternal Grandfather   . Brain cancer Paternal Aunt 46  . Cancer Paternal Grandfather 42       metastatic to bone, thinks primary may have been prostate  . Melanoma Cousin   . Thyroid cancer Cousin  had thyroid disease prior  . Lung cancer Cousin     Social History   Tobacco Use  . Smoking status: Never Smoker  . Smokeless tobacco: Never Used  Substance Use Topics  . Alcohol use: Yes    Alcohol/week: 0.0 standard drinks    Comment: social    Subjective:  Patient presents with concerns for UTI; symptoms present x 4-5 days; + burning, urgency; no blood in urine; no fever; no back pain; not prone to UTIs;   Objective:  Vitals:   08/16/18 1526  BP: 126/72  Pulse: 83  Temp: 97.7 F (36.5 C)  TempSrc: Oral  SpO2: 99%  Weight: 137 lb 1.3 oz  (62.2 kg)  Height: 5\' 4"  (1.626 m)    General: Well developed, well nourished, in no acute distress  Skin : Warm and dry.  Head: Normocephalic and atraumatic  Lungs: Respirations unlabored;  Neurologic: Alert and oriented; speech intact; face symmetrical; moves all extremities well; CNII-XII intact without focal deficit  Assessment:  1. Dysuria     Plan:  Suspect UTI; update U/A and urine culture; Rx for Cipro 250 mg bid x 3 days; increase fluids, specifically water; follow-up as needed based on culture.   No follow-ups on file.  Orders Placed This Encounter  Procedures  . POCT Urinalysis Dipstick (Automated)    Requested Prescriptions    No prescriptions requested or ordered in this encounter

## 2018-08-16 NOTE — Progress Notes (Signed)
Ensure pre surgical drink with instructions to consume by 5am day of procedure given to patient, also, hibiclens given with instruction to use the night before and morning of procedure. Pt verbalized understanding.

## 2018-08-18 LAB — URINE CULTURE
MICRO NUMBER: 91412851
SPECIMEN QUALITY: ADEQUATE

## 2018-08-20 ENCOUNTER — Encounter (HOSPITAL_BASED_OUTPATIENT_CLINIC_OR_DEPARTMENT_OTHER): Payer: Self-pay

## 2018-08-20 ENCOUNTER — Ambulatory Visit (HOSPITAL_BASED_OUTPATIENT_CLINIC_OR_DEPARTMENT_OTHER): Payer: BLUE CROSS/BLUE SHIELD | Admitting: Anesthesiology

## 2018-08-20 ENCOUNTER — Encounter (HOSPITAL_BASED_OUTPATIENT_CLINIC_OR_DEPARTMENT_OTHER)
Admission: RE | Disposition: A | Discharge status: Home / Self Care | Payer: Self-pay | Source: Ambulatory Visit | Attending: Plastic Surgery

## 2018-08-20 ENCOUNTER — Ambulatory Visit (HOSPITAL_BASED_OUTPATIENT_CLINIC_OR_DEPARTMENT_OTHER)
Admission: RE | Admit: 2018-08-20 | Discharge: 2018-08-20 | Disposition: A | Discharge status: Home / Self Care | Payer: BLUE CROSS/BLUE SHIELD | Source: Ambulatory Visit | Attending: Plastic Surgery | Admitting: Plastic Surgery

## 2018-08-20 ENCOUNTER — Other Ambulatory Visit: Payer: Self-pay

## 2018-08-20 DIAGNOSIS — N6489 Other specified disorders of breast: Secondary | ICD-10-CM | POA: Insufficient documentation

## 2018-08-20 DIAGNOSIS — Z9013 Acquired absence of bilateral breasts and nipples: Secondary | ICD-10-CM | POA: Insufficient documentation

## 2018-08-20 DIAGNOSIS — Z853 Personal history of malignant neoplasm of breast: Secondary | ICD-10-CM | POA: Diagnosis not present

## 2018-08-20 DIAGNOSIS — Z170 Estrogen receptor positive status [ER+]: Secondary | ICD-10-CM | POA: Insufficient documentation

## 2018-08-20 DIAGNOSIS — C50912 Malignant neoplasm of unspecified site of left female breast: Secondary | ICD-10-CM | POA: Diagnosis not present

## 2018-08-20 DIAGNOSIS — Z421 Encounter for breast reconstruction following mastectomy: Secondary | ICD-10-CM | POA: Insufficient documentation

## 2018-08-20 DIAGNOSIS — Z79810 Long term (current) use of selective estrogen receptor modulators (SERMs): Secondary | ICD-10-CM | POA: Insufficient documentation

## 2018-08-20 DIAGNOSIS — E039 Hypothyroidism, unspecified: Secondary | ICD-10-CM | POA: Diagnosis not present

## 2018-08-20 HISTORY — PX: LIPOSUCTION WITH LIPOFILLING: SHX6436

## 2018-08-20 HISTORY — PX: REMOVAL OF BILATERAL TISSUE EXPANDERS WITH PLACEMENT OF BILATERAL BREAST IMPLANTS: SHX6431

## 2018-08-20 SURGERY — REMOVAL, TISSUE EXPANDER, BREAST, BILATERAL, WITH BILATERAL IMPLANT IMPLANT INSERTION
Anesthesia: General | Site: Chest | Laterality: Bilateral

## 2018-08-20 MED ORDER — SUCCINYLCHOLINE CHLORIDE 200 MG/10ML IV SOSY
PREFILLED_SYRINGE | INTRAVENOUS | Status: AC
  Filled 2018-08-20: qty 10

## 2018-08-20 MED ORDER — POVIDONE-IODINE 10 % EX SOLN
CUTANEOUS | Status: DC | PRN
  Administered 2018-08-20: 1 via TOPICAL

## 2018-08-20 MED ORDER — GABAPENTIN 300 MG PO CAPS
ORAL_CAPSULE | ORAL | Status: AC
  Filled 2018-08-20: qty 1

## 2018-08-20 MED ORDER — SODIUM CHLORIDE 0.9 % IV SOLN
INTRAVENOUS | Status: DC | PRN
  Administered 2018-08-20: 1000 mL

## 2018-08-20 MED ORDER — CELECOXIB 200 MG PO CAPS
ORAL_CAPSULE | ORAL | Status: AC
  Filled 2018-08-20: qty 1

## 2018-08-20 MED ORDER — SUGAMMADEX SODIUM 200 MG/2ML IV SOLN
INTRAVENOUS | Status: AC
  Filled 2018-08-20: qty 2

## 2018-08-20 MED ORDER — ROCURONIUM BROMIDE 50 MG/5ML IV SOSY
PREFILLED_SYRINGE | INTRAVENOUS | Status: AC
  Filled 2018-08-20: qty 5

## 2018-08-20 MED ORDER — LIDOCAINE HCL (PF) 1 % IJ SOLN
INTRAMUSCULAR | Status: AC
  Filled 2018-08-20: qty 30

## 2018-08-20 MED ORDER — GABAPENTIN 300 MG PO CAPS
300.0000 mg | ORAL_CAPSULE | ORAL | Status: AC
  Administered 2018-08-20: 300 mg via ORAL

## 2018-08-20 MED ORDER — FENTANYL CITRATE (PF) 100 MCG/2ML IJ SOLN
INTRAMUSCULAR | Status: AC
  Filled 2018-08-20: qty 2

## 2018-08-20 MED ORDER — SODIUM BICARBONATE 4 % IV SOLN
INTRAVENOUS | Status: DC | PRN
  Administered 2018-08-20: 400 mL via INTRAMUSCULAR

## 2018-08-20 MED ORDER — OXYCODONE HCL 5 MG PO TABS
5.0000 mg | ORAL_TABLET | Freq: Once | ORAL | Status: AC | PRN
  Administered 2018-08-20: 5 mg via ORAL

## 2018-08-20 MED ORDER — CHLORHEXIDINE GLUCONATE CLOTH 2 % EX PADS: 6.0000 | MEDICATED_PAD | Freq: Once | CUTANEOUS | Status: DC

## 2018-08-20 MED ORDER — ACETAMINOPHEN 500 MG PO TABS
ORAL_TABLET | ORAL | Status: AC
  Filled 2018-08-20: qty 2

## 2018-08-20 MED ORDER — EPHEDRINE 5 MG/ML INJ
INTRAVENOUS | Status: AC
  Filled 2018-08-20: qty 10

## 2018-08-20 MED ORDER — DEXAMETHASONE SODIUM PHOSPHATE 10 MG/ML IJ SOLN
INTRAMUSCULAR | Status: AC
  Filled 2018-08-20: qty 1

## 2018-08-20 MED ORDER — OXYCODONE HCL 5 MG PO TABS
ORAL_TABLET | ORAL | Status: AC
  Filled 2018-08-20: qty 1

## 2018-08-20 MED ORDER — DEXAMETHASONE SODIUM PHOSPHATE 4 MG/ML IJ SOLN
INTRAMUSCULAR | Status: DC | PRN
  Administered 2018-08-20: 10 mg via INTRAVENOUS

## 2018-08-20 MED ORDER — EPINEPHRINE 30 MG/30ML IJ SOLN
INTRAMUSCULAR | Status: AC
  Filled 2018-08-20: qty 1

## 2018-08-20 MED ORDER — MIDAZOLAM HCL 2 MG/2ML IJ SOLN
INTRAMUSCULAR | Status: AC
  Filled 2018-08-20: qty 2

## 2018-08-20 MED ORDER — SULFAMETHOXAZOLE-TRIMETHOPRIM 800-160 MG PO TABS: 1.0000 | ORAL_TABLET | Freq: Two times a day (BID) | ORAL | 0 refills | Status: DC

## 2018-08-20 MED ORDER — OXYCODONE HCL 5 MG/5ML PO SOLN: 5.0000 mg | Freq: Once | ORAL | Status: AC | PRN

## 2018-08-20 MED ORDER — FENTANYL CITRATE (PF) 100 MCG/2ML IJ SOLN
25.0000 ug | INTRAMUSCULAR | Status: DC | PRN
  Administered 2018-08-20 (×3): 50 ug via INTRAVENOUS

## 2018-08-20 MED ORDER — KETOROLAC TROMETHAMINE 30 MG/ML IJ SOLN: 30.0000 mg | Freq: Once | INTRAMUSCULAR | Status: DC | PRN

## 2018-08-20 MED ORDER — ACETAMINOPHEN 325 MG PO TABS: 325.0000 mg | ORAL_TABLET | ORAL | Status: DC | PRN

## 2018-08-20 MED ORDER — ONDANSETRON HCL 4 MG/2ML IJ SOLN
INTRAMUSCULAR | Status: AC
  Filled 2018-08-20: qty 2

## 2018-08-20 MED ORDER — PHENYLEPHRINE 40 MCG/ML (10ML) SYRINGE FOR IV PUSH (FOR BLOOD PRESSURE SUPPORT)
PREFILLED_SYRINGE | INTRAVENOUS | Status: AC
  Filled 2018-08-20: qty 10

## 2018-08-20 MED ORDER — OXYCODONE HCL 5 MG PO TABS: 5.0000 mg | ORAL_TABLET | ORAL | 0 refills | Status: DC | PRN

## 2018-08-20 MED ORDER — SUGAMMADEX SODIUM 200 MG/2ML IV SOLN
INTRAVENOUS | Status: DC | PRN
  Administered 2018-08-20: 200 mg via INTRAVENOUS

## 2018-08-20 MED ORDER — MEPERIDINE HCL 25 MG/ML IJ SOLN: 6.2500 mg | INTRAMUSCULAR | Status: DC | PRN

## 2018-08-20 MED ORDER — FENTANYL CITRATE (PF) 100 MCG/2ML IJ SOLN
50.0000 ug | INTRAMUSCULAR | Status: DC | PRN
  Administered 2018-08-20: 100 ug via INTRAVENOUS

## 2018-08-20 MED ORDER — LIDOCAINE HCL (CARDIAC) PF 100 MG/5ML IV SOSY
PREFILLED_SYRINGE | INTRAVENOUS | Status: DC | PRN
  Administered 2018-08-20: 100 mg via INTRAVENOUS

## 2018-08-20 MED ORDER — CEFAZOLIN SODIUM-DEXTROSE 2-4 GM/100ML-% IV SOLN
2.0000 g | INTRAVENOUS | Status: AC
  Administered 2018-08-20: 2 g via INTRAVENOUS

## 2018-08-20 MED ORDER — SCOPOLAMINE 1 MG/3DAYS TD PT72: 1.0000 | MEDICATED_PATCH | Freq: Once | TRANSDERMAL | Status: DC | PRN

## 2018-08-20 MED ORDER — LACTATED RINGERS IV SOLN
INTRAVENOUS | Status: DC
  Administered 2018-08-20 (×2): via INTRAVENOUS

## 2018-08-20 MED ORDER — CEFAZOLIN SODIUM-DEXTROSE 2-4 GM/100ML-% IV SOLN
INTRAVENOUS | Status: AC
  Filled 2018-08-20: qty 100

## 2018-08-20 MED ORDER — CELECOXIB 200 MG PO CAPS
200.0000 mg | ORAL_CAPSULE | ORAL | Status: AC
  Administered 2018-08-20: 200 mg via ORAL

## 2018-08-20 MED ORDER — ROCURONIUM BROMIDE 100 MG/10ML IV SOLN
INTRAVENOUS | Status: DC | PRN
  Administered 2018-08-20: 40 mg via INTRAVENOUS

## 2018-08-20 MED ORDER — ACETAMINOPHEN 500 MG PO TABS
1000.0000 mg | ORAL_TABLET | ORAL | Status: AC
  Administered 2018-08-20: 1000 mg via ORAL

## 2018-08-20 MED ORDER — ACETAMINOPHEN 160 MG/5ML PO SOLN: 325.0000 mg | ORAL | Status: DC | PRN

## 2018-08-20 MED ORDER — PROPOFOL 10 MG/ML IV BOLUS
INTRAVENOUS | Status: DC | PRN
  Administered 2018-08-20: 150 mg via INTRAVENOUS

## 2018-08-20 MED ORDER — SODIUM BICARBONATE 4 % IV SOLN
INTRAVENOUS | Status: AC
  Filled 2018-08-20: qty 5

## 2018-08-20 MED ORDER — MIDAZOLAM HCL 2 MG/2ML IJ SOLN
1.0000 mg | INTRAMUSCULAR | Status: DC | PRN
  Administered 2018-08-20: 2 mg via INTRAVENOUS

## 2018-08-20 MED ORDER — LIDOCAINE 2% (20 MG/ML) 5 ML SYRINGE
INTRAMUSCULAR | Status: AC
  Filled 2018-08-20: qty 5

## 2018-08-20 MED ORDER — ONDANSETRON HCL 4 MG/2ML IJ SOLN: 4.0000 mg | Freq: Once | INTRAMUSCULAR | Status: DC | PRN

## 2018-08-20 MED ORDER — PROPOFOL 10 MG/ML IV BOLUS
INTRAVENOUS | Status: AC
  Filled 2018-08-20: qty 20

## 2018-08-20 SURGICAL SUPPLY — 85 items
ADH SKN CLS APL DERMABOND .7 (GAUZE/BANDAGES/DRESSINGS) ×4
BAG DECANTER FOR FLEXI CONT (MISCELLANEOUS) ×3 IMPLANT
BINDER ABDOMINAL 10 UNV 27-48 (MISCELLANEOUS) ×1 IMPLANT
BINDER ABDOMINAL 12 SM 30-45 (SOFTGOODS) IMPLANT
BINDER BREAST 3XL (GAUZE/BANDAGES/DRESSINGS) IMPLANT
BINDER BREAST LRG (GAUZE/BANDAGES/DRESSINGS) IMPLANT
BINDER BREAST MEDIUM (GAUZE/BANDAGES/DRESSINGS) IMPLANT
BINDER BREAST XLRG (GAUZE/BANDAGES/DRESSINGS) ×1 IMPLANT
BINDER BREAST XXLRG (GAUZE/BANDAGES/DRESSINGS) IMPLANT
BLADE SURG 10 STRL SS (BLADE) ×8 IMPLANT
BLADE SURG 11 STRL SS (BLADE) ×3 IMPLANT
BNDG GAUZE ELAST 4 BULKY (GAUZE/BANDAGES/DRESSINGS) ×6 IMPLANT
CANISTER LIPO FAT HARVEST (MISCELLANEOUS) ×3 IMPLANT
CANISTER SUCT 1200ML W/VALVE (MISCELLANEOUS) ×3 IMPLANT
CHLORAPREP W/TINT 26ML (MISCELLANEOUS) ×6 IMPLANT
COVER BACK TABLE 60X90IN (DRAPES) ×3 IMPLANT
COVER MAYO STAND STRL (DRAPES) ×6 IMPLANT
COVER WAND RF STERILE (DRAPES) IMPLANT
DECANTER SPIKE VIAL GLASS SM (MISCELLANEOUS) IMPLANT
DERMABOND ADVANCED (GAUZE/BANDAGES/DRESSINGS) ×2
DERMABOND ADVANCED .7 DNX12 (GAUZE/BANDAGES/DRESSINGS) ×4 IMPLANT
DRAIN CHANNEL 15F RND FF W/TCR (WOUND CARE) IMPLANT
DRAPE TOP ARMCOVERS (MISCELLANEOUS) ×3 IMPLANT
DRAPE U-SHAPE 76X120 STRL (DRAPES) ×3 IMPLANT
DRSG PAD ABDOMINAL 8X10 ST (GAUZE/BANDAGES/DRESSINGS) ×8 IMPLANT
ELECT BLADE 4.0 EZ CLEAN MEGAD (MISCELLANEOUS)
ELECT COATED BLADE 2.86 ST (ELECTRODE) ×3 IMPLANT
ELECT REM PT RETURN 9FT ADLT (ELECTROSURGICAL) ×3
ELECTRODE BLDE 4.0 EZ CLN MEGD (MISCELLANEOUS) ×2 IMPLANT
ELECTRODE REM PT RTRN 9FT ADLT (ELECTROSURGICAL) ×2 IMPLANT
EVACUATOR SILICONE 100CC (DRAIN) IMPLANT
GLOVE BIO SURGEON STRL SZ 6 (GLOVE) ×7 IMPLANT
GLOVE BIO SURGEON STRL SZ7 (GLOVE) ×3 IMPLANT
GLOVE BIOGEL PI IND STRL 7.0 (GLOVE) IMPLANT
GLOVE BIOGEL PI IND STRL 7.5 (GLOVE) IMPLANT
GLOVE BIOGEL PI INDICATOR 7.0 (GLOVE) ×2
GLOVE BIOGEL PI INDICATOR 7.5 (GLOVE) ×1
GOWN STRL REUS W/ TWL LRG LVL3 (GOWN DISPOSABLE) ×4 IMPLANT
GOWN STRL REUS W/TWL LRG LVL3 (GOWN DISPOSABLE) ×6
IMPL BREAST SILICONE 525CC (Breast) IMPLANT
IMPLANT BREAST SILICONE 525CC (Breast) ×6 IMPLANT
IV NS 500ML (IV SOLUTION)
IV NS 500ML BAXH (IV SOLUTION) IMPLANT
KIT FILL SYSTEM UNIVERSAL (SET/KITS/TRAYS/PACK) IMPLANT
LINER CANISTER 1000CC FLEX (MISCELLANEOUS) ×3 IMPLANT
MARKER SKIN DUAL TIP RULER LAB (MISCELLANEOUS) IMPLANT
NDL FILTER BLUNT 18X1 1/2 (NEEDLE) IMPLANT
NDL HYPO 25X1 1.5 SAFETY (NEEDLE) IMPLANT
NDL SAFETY ECLIPSE 18X1.5 (NEEDLE) ×2 IMPLANT
NEEDLE FILTER BLUNT 18X 1/2SAF (NEEDLE)
NEEDLE FILTER BLUNT 18X1 1/2 (NEEDLE) IMPLANT
NEEDLE HYPO 18GX1.5 SHARP (NEEDLE) ×3
NEEDLE HYPO 25X1 1.5 SAFETY (NEEDLE) IMPLANT
NS IRRIG 1000ML POUR BTL (IV SOLUTION) ×3 IMPLANT
PACK BASIN DAY SURGERY FS (CUSTOM PROCEDURE TRAY) ×3 IMPLANT
PAD ALCOHOL SWAB (MISCELLANEOUS) ×3 IMPLANT
PENCIL BUTTON HOLSTER BLD 10FT (ELECTRODE) ×3 IMPLANT
PIN SAFETY STERILE (MISCELLANEOUS) IMPLANT
PUNCH BIOPSY DERMAL 4MM (MISCELLANEOUS) IMPLANT
SHEET MEDIUM DRAPE 40X70 STRL (DRAPES) ×5 IMPLANT
SIZER BREAST REUSE GEL 525CC (SIZER) ×3
SIZER BRST REUSE GEL 525CC (SIZER) IMPLANT
SLEEVE SCD COMPRESS KNEE MED (MISCELLANEOUS) ×3 IMPLANT
SPONGE LAP 18X18 RF (DISPOSABLE) ×6 IMPLANT
STAPLER VISISTAT 35W (STAPLE) ×3 IMPLANT
SUT ETHILON 2 0 FS 18 (SUTURE) IMPLANT
SUT MNCRL AB 4-0 PS2 18 (SUTURE) ×6 IMPLANT
SUT PDS AB 2-0 CT2 27 (SUTURE) IMPLANT
SUT VIC AB 3-0 PS1 18 (SUTURE)
SUT VIC AB 3-0 PS1 18XBRD (SUTURE) IMPLANT
SUT VIC AB 3-0 SH 27 (SUTURE) ×6
SUT VIC AB 3-0 SH 27X BRD (SUTURE) ×4 IMPLANT
SUT VICRYL 4-0 PS2 18IN ABS (SUTURE) ×6 IMPLANT
SYR 10ML LL (SYRINGE) ×10 IMPLANT
SYR 20CC LL (SYRINGE) IMPLANT
SYR 50ML LL SCALE MARK (SYRINGE) ×12 IMPLANT
SYR BULB IRRIGATION 50ML (SYRINGE) ×6 IMPLANT
SYR CONTROL 10ML LL (SYRINGE) IMPLANT
SYR TB 1ML LL NO SAFETY (SYRINGE) ×3 IMPLANT
TOWEL GREEN STERILE FF (TOWEL DISPOSABLE) ×6 IMPLANT
TUBE CONNECTING 20X1/4 (TUBING) ×6 IMPLANT
TUBING INFILTRATION IT-10001 (TUBING) ×3 IMPLANT
TUBING SET GRADUATE ASPIR 12FT (MISCELLANEOUS) ×3 IMPLANT
UNDERPAD 30X30 (UNDERPADS AND DIAPERS) ×6 IMPLANT
YANKAUER SUCT BULB TIP NO VENT (SUCTIONS) ×3 IMPLANT

## 2018-08-20 NOTE — H&P (Signed)
Subjective:     Patient ID: Ariel Tran is a 52 y.o. female.  HPI  Nearly 5 months post op bilateral mastectomies with immediate expander based reconstruction. Here for implant exchange.  Presented with palpable mass in the RIGHT breast. MMG/US did not reveal a correlate. Distortion noted in the LEFT breast at 10:30 which measured 2.7 x 2.5 x 1.3 cm. Axilla was negative by [place]. Biopsy revealed IDC ER/PR + HER-2 -. MRI demonstrated extensive confluent abnormality h of the LEFT UOQ measuring 5.0 cm, crossing midline to UIQ. Suspicious enhancement in the surrounding the confluent area as well as in retroareolar left breast and in the LIQ noted. An indeterminate 1.5 cm area of enhancement in the RIGHT UIQ noted and a 54m mass in the RIGHT LOQ noted.    Additional biopsies RIGHT breast 9 o clock read as fibroadenoma and RIGHT breast 2 o clock with PASH.   Final pathology right PASH, fibroadenoma, left mixed ILC/IDC 3.5 cm, margins clear, 0.4 LSN.  [place] Oncotype 17/5%. On Tamoxifen.   Prior DD, desires smaller. 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  Pulmonary/Chest: Effort normal and breath sounds normal.   Chest: soft bilateral, all areas healed, expanded with bilateral rippling Abd: soft no hernias    Assessment:     Left breast ca multicentric, ER+ S/p bilateral SRM with prepectoral TE/ADM (Alloderm) reconstruction    Plan:     Plan implant exchange and lipofilling bilateral chest. Reviewed saline vs silicone, shaped vs round.  As in prepectoral position I do recommend HCG or capacity filled silicone implants to reduce risk visible rippling.  Reviewed MRI or [place] for rupture with silicone implants. She is concerned about silicone  making her sick. Reviewed no proven association with silicone and autoimmune d/o. Reviewed examples for 4th generation, capacity filled 4th generation, and HCG implants vs saline implants. Reviewed risks AP flipping that may be more noticeable with 5th generation implants, may require surgery to correct. Plan at this time smooth round silicone implants, plan capacity filled.    Discussed role of fat grafting to improve contour, reduce visible rippling. Discussed variable take graft, need to repeat procedure, fat necrosis that presents as lumps, cysts. Reviewed donor site incisions, bruising, need for compression. Plan abdomen infra and supra umbilical as donor site.  Reviewed size largely guided by width chest, cannot assure cup size, will be smaller than her native breasts.  Plan OP surgery, no drains anticipated. Will utilize IMF portion scars. Counseled I do not recommend NAC reconstruction at same time, recommend she ensure she is happy with implant position and size prior to this.   Natrelle 133FV-12-T 400 ml tissue expanders placed bilateral, fill volume 400 ml saline bilateral  BIrene Limbo MD [facility] [phone] pin [phone]

## 2018-08-20 NOTE — Anesthesia Postprocedure Evaluation (Signed)
Anesthesia Post Note  Patient: Ariel Tran  Procedure(s) Performed: REMOVAL OF BILATERAL TISSUE EXPANDERS WITH PLACEMENT OF BILATERAL BREAST IMPLANTS (Bilateral Breast) LIPOFILLING FROM ABDOMEN TO BILATERAL CHEST (Bilateral Chest)     Patient location during evaluation: PACU Anesthesia Type: General Level of consciousness: awake Pain management: pain level controlled Vital Signs Assessment: post-procedure vital signs reviewed and stable Respiratory status: spontaneous breathing Cardiovascular status: stable Postop Assessment: no apparent nausea or vomiting Anesthetic complications: no    Last Vitals:  Vitals:   08/20/18 1100 08/20/18 1115  BP: (!) 124/95 (!) 145/98  Pulse: 85 79  Resp: 15 13  Temp:    SpO2: 95% 99%    Last Pain:  Vitals:   08/20/18 1130  TempSrc:   PainSc: 5    Pain Goal:                 Huston Foley

## 2018-08-20 NOTE — Discharge Instructions (Signed)

## 2018-08-20 NOTE — Anesthesia Procedure Notes (Signed)
Procedure Name: Intubation Date/Time: 08/20/2018 8:30 AM Performed by: Willa Frater, CRNA Pre-anesthesia Checklist: Patient identified, Emergency Drugs available, Suction available and Patient being monitored Patient Re-evaluated:Patient Re-evaluated prior to induction Oxygen Delivery Method: Circle system utilized Preoxygenation: Pre-oxygenation with 100% oxygen Induction Type: IV induction Ventilation: Mask ventilation without difficulty Laryngoscope Size: Mac and 3 Grade View: Grade I Tube type: Oral Tube size: 7.0 mm Number of attempts: 1 Airway Equipment and Method: Stylet and Oral airway Placement Confirmation: ETT inserted through vocal cords under direct vision,  positive ETCO2 and breath sounds checked- equal and bilateral Secured at: 21 cm Tube secured with: Tape Dental Injury: Teeth and Oropharynx as per pre-operative assessment

## 2018-08-20 NOTE — Anesthesia Preprocedure Evaluation (Addendum)
Anesthesia Evaluation  Patient identified by MRN, date of birth, ID band Patient awake    Reviewed: Allergy & Precautions, NPO status , Patient's Chart, lab work & pertinent test results  History of Anesthesia Complications (+) PONV  Airway Mallampati: II  TM Distance: >3 FB Neck ROM: Full    Dental no notable dental hx. (+) Teeth Intact   Pulmonary neg pulmonary ROS,    Pulmonary exam normal breath sounds clear to auscultation       Cardiovascular negative cardio ROS Normal cardiovascular exam+ Valvular Problems/Murmurs  Rhythm:Regular Rate:Normal     Neuro/Psych negative neurological ROS  negative psych ROS   GI/Hepatic negative GI ROS, Neg liver ROS,   Endo/Other  Hypothyroidism   Renal/GU negative Renal ROS  negative genitourinary   Musculoskeletal negative musculoskeletal ROS (+)   Abdominal Normal abdominal exam  (+)   Peds  Hematology negative hematology ROS (+)   Anesthesia Other Findings   Reproductive/Obstetrics negative OB ROS                            Anesthesia Physical  Anesthesia Plan  ASA: II  Anesthesia Plan: General   Post-op Pain Management:    Induction: Intravenous  PONV Risk Score and Plan: 4 or greater and Ondansetron, Dexamethasone, Midazolam, Scopolamine patch - Pre-op and Treatment may vary due to age or medical condition  Airway Management Planned: Oral ETT  Additional Equipment:   Intra-op Plan:   Post-operative Plan: Extubation in OR  Informed Consent: I have reviewed the patients History and Physical, chart, labs and discussed the procedure including the risks, benefits and alternatives for the proposed anesthesia with the patient or authorized representative who has indicated his/her understanding and acceptance.   Dental advisory given  Plan Discussed with: CRNA and Surgeon  Anesthesia Plan Comments:        Anesthesia Quick  Evaluation

## 2018-08-20 NOTE — Op Note (Addendum)
Operative Note   DATE OF OPERATION: 11.26.2019  LOCATION: [facility]-outpatient  SURGICAL DIVISION: Plastic Surgery  PREOPERATIVE DIAGNOSES:  1. History breast cancer 2. Acquired absence breasts  POSTOPERATIVE DIAGNOSES:  same  PROCEDURE:  1. Removal bilateral chest tissue expanders and placement silicone implants 2. Lipofilling to bilateral chest  SURGEON: Irene Limbo MD MBA  ASSISTANT: none  ANESTHESIA:  General.   EBL: 20 ml  COMPLICATIONS: None immediate.   INDICATIONS FOR PROCEDURE:  The patient, Ariel Tran, is a 52 y.o. female born on [birth year], is here for staged breast reconstruction following bilateral skin reduction mastectomies with immediate prepectoral expander ADM reconstruction.   FINDINGS: Complete incorporation ADM noted bilateral. 47 ml fat infiltrated over right chest and 45 ml infiltrated over left chest. Natrelle Inspira Smooth Round Extra Projection 525 ml implants placed bilateral. REF SRX-525 RIGHT SN [id number] LEFT SN [id number]  DESCRIPTION OF PROCEDURE:  The patient's operative site was marked with the patient in the preoperative area to mark sternal notch, chest midline, anterior axillary lines.Bilateral supra and infraumbilical abdomen marked for liposuction.The patientwas taken to the operating room. SCDs were placed and IV antibiotics were given. The patient's operative site was prepped and draped in a sterile fashion. A time out was performed and all information was confirmed to be correct.I began on leftside. Incision made through priorinframammary foldscar and carried through superficial fascia to acellular demis.ADM incised.Expander removed. Capsulotomies performedsuperiorly.Sizer placed.  I then directed attention toright chest. Patient had more soft tissue redundancy on right. Elliptical incision of skin over lower pole just superior to scar sn IMF completed and area deepithelialized. Incision then carried through  superficial fascia andimplant cavityentered in similar manner. Expander removed and well incorporated ADM noted. Capsulotomies performed superiorly.Sizer placed and patient brought to upright sitting position. Natrelle Smooth RoundExtra Projection 525 mlimplants selected for bilateral placement.The patient was returned to supine position.   Stab incision made over bilateralabdomen. Tumescent fluid infiltrated over supra and infraumbilical NTIRWER,[id number] tumescent infiltrated. Power assisted liposuction performed to endpoint symmetric contour and soft tissue thickness, total lipoaspirate211ml. The fat was then washed and prepared by gravity for infiltration. Harvested fat was then infiltrated in subcutaneous plane throughout total envelope mastectomy flaps.  Each cavity irrigated with bacitracin, Ancef, gentamicinsolution.Hemostasis ensured.Each cavity then irrigated with Betadine. The implant was placed inleft chest andimplant orientation ensured. Closure completed with 3-0 vicryl to closesuperficial fascia and ADMover implant.4-0 vicryl used to close dermis followed by 4-0 monocryl subcuticular. Implant placed inright chest cavity.Closure completedin similar fashion.Abdomen incisions approximated with simple 4-0 monocryl stitch.Tissue adhesive and dry dressing applied, followed by breastbinderand abdominal compression garment.  The patient was allowed to wake from anesthesia, extubated and taken to the recovery room in satisfactory condition.   SPECIMENS: none  DRAINS: none  Irene Limbo, MD [facility] Plastic & Reconstructive Surgery [phone], pin [phone]

## 2018-08-20 NOTE — Transfer of Care (Signed)
Immediate Anesthesia Transfer of Care Note  Patient: Ariel Tran  Procedure(s) Performed: REMOVAL OF BILATERAL TISSUE EXPANDERS WITH PLACEMENT OF BILATERAL BREAST IMPLANTS (Bilateral Breast) LIPOFILLING FROM ABDOMEN TO BILATERAL CHEST (Bilateral Chest)  Patient Location: PACU  Anesthesia Type:General  Level of Consciousness: awake, alert , oriented and drowsy  Airway & Oxygen Therapy: Patient Spontanous Breathing and Patient connected to face mask oxygen  Post-op Assessment: Report given to RN and Post -op Vital signs reviewed and stable  Post vital signs: Reviewed and stable  Last Vitals:  Vitals Value Taken Time  BP 132/95 08/20/2018 10:34 AM  Temp    Pulse 97 08/20/2018 10:36 AM  Resp 17 08/20/2018 10:36 AM  SpO2 100 % 08/20/2018 10:36 AM  Vitals shown include unvalidated device data.  Last Pain:  Vitals:   08/20/18 0737  TempSrc: Oral  PainSc: 0-No pain         Complications: No apparent anesthesia complications

## 2018-08-21 ENCOUNTER — Encounter (HOSPITAL_BASED_OUTPATIENT_CLINIC_OR_DEPARTMENT_OTHER): Payer: Self-pay | Admitting: Plastic Surgery

## 2018-08-26 ENCOUNTER — Encounter: Payer: Self-pay | Admitting: Physician Assistant

## 2018-08-26 ENCOUNTER — Ambulatory Visit (INDEPENDENT_AMBULATORY_CARE_PROVIDER_SITE_OTHER): Payer: BLUE CROSS/BLUE SHIELD | Admitting: Physician Assistant

## 2018-08-26 VITALS — BP 167/93

## 2018-08-26 DIAGNOSIS — F901 Attention-deficit hyperactivity disorder, predominantly hyperactive type: Secondary | ICD-10-CM

## 2018-08-26 MED ORDER — AMPHETAMINE-DEXTROAMPHETAMINE 15 MG PO TABS: 15.0000 mg | ORAL_TABLET | Freq: Two times a day (BID) | ORAL | 0 refills | Status: DC

## 2018-08-26 NOTE — Progress Notes (Signed)
[facility] Med Check  Patient ID: Ariel Tran,  MRN: [id number]  PCP: Hoyt Koch, MD  Date of Evaluation: 08/26/2018 Time spent:15 minutes  Chief Complaint:  Chief Complaint    Follow-up      HISTORY/CURRENT STATUS: HPI Here for 6 month med check.  Had breast reconstruction 08/20/18.  Is already back at work.  She feels fine.  Is still taking the tamoxifen.  States she is very thankful that the breast cancer was found so early, and she is doing well.  No depressive symptoms from that.  She sleeps well.  States that attention is good without easy distractibility.  Able to focus on things and finish tasks to completion.  Feels the Adderall is still working well.  She does not take it twice a day every day, just when she feels like she needs it.  Individual Medical History/ Review of Systems: Changes? :Yes    Past medications for mental health diagnoses include: Adderall, Celexa, Ambien, Xanax, Wellbutrin  Allergies: Levothyroxine sodium; Chicken allergy; Citrus; Contrast media [iodinated diagnostic agents]; Eggs or egg-derived products; and Tramadol  Current Medications:  Current Outpatient Medications:  .  amphetamine-dextroamphetamine (ADDERALL) 15 MG tablet, Take 1 tablet by mouth 2 (two) times daily., Disp: 60 tablet, Rfl: 0 .  sulfamethoxazole-trimethoprim (BACTRIM DS,SEPTRA DS) 800-160 MG tablet, Take 1 tablet by mouth 2 (two) times daily., Disp: 14 tablet, Rfl: 0 .  tamoxifen (NOLVADEX) 10 MG tablet, Take 1 tablet (10 mg total) by mouth daily. If tolerates can increase to 2 tablets daily, Disp: 60 tablet, Rfl: 0 .  amphetamine-dextroamphetamine (ADDERALL) 15 MG tablet, Take 1 tablet by mouth 2 (two) times daily., Disp: 60 tablet, Rfl: 0 .  [START ON 09/25/2018] amphetamine-dextroamphetamine (ADDERALL) 15 MG tablet, Take 1 tablet by mouth 2 (two) times daily., Disp: 60 tablet, Rfl: 0 .  [START ON 10/24/2018] amphetamine-dextroamphetamine (ADDERALL) 15 MG tablet,  Take 1 tablet by mouth 2 (two) times daily., Disp: 60 tablet, Rfl: 0 .  Multiple Vitamins-Minerals (MULTIVITAMIN WITH MINERALS) tablet, Take 1 tablet by mouth daily., Disp: , Rfl:  .  oxyCODONE (ROXICODONE) 5 MG immediate release tablet, Take 1 tablet (5 mg total) by mouth every 4 (four) hours as needed. (Patient not taking: Reported on 08/26/2018), Disp: 25 tablet, Rfl: 0 Medication Side Effects: none  Family Medical/ Social History: Changes? No  MENTAL HEALTH EXAM:  Blood pressure (!) 167/93.There is no height or weight on file to calculate BMI.  General Appearance: Casual and Well Groomed  Eye Contact:  Good  Speech:  Clear and Coherent  Volume:  Normal  Mood:  Euthymic  Affect:  Appropriate  Thought Process:  Goal Directed  Orientation:  Full (Time, Place, and Person)  Thought Content: Logical   Suicidal Thoughts:  No  Homicidal Thoughts:  No  Memory:  WNL  Judgement:  Good  Insight:  Good  Psychomotor Activity:  Normal  Concentration:  Concentration: Good  Recall:  Good  Fund of Knowledge: Good  Language: Good  Assets:  Desire for Improvement  ADL's:  Intact  Cognition: WNL  Prognosis:  Good    DIAGNOSES:    ICD-10-CM   1. Attention deficit hyperactivity disorder (ADHD), predominantly hyperactive type F90.1   [place] hypertension, hypothyroidism, breast cancer status post Bilateral mastectomy 03/26/18 with bilateral reconstruction 08/20/2018  Receiving Psychotherapy: No    RECOMMENDATIONS: Continue Adderall as above. Return in 6 months or sooner as needed.  Donnal Moat, PA-C

## 2018-10-11 ENCOUNTER — Telehealth: Payer: Self-pay | Admitting: Hematology and Oncology

## 2018-10-11 NOTE — Telephone Encounter (Signed)
Called pt re appt being moved due to [facility] call day - left vm for pt re appt changes.

## 2018-10-17 ENCOUNTER — Telehealth: Payer: Self-pay | Admitting: Hematology and Oncology

## 2018-10-17 ENCOUNTER — Ambulatory Visit: Payer: BLUE CROSS/BLUE SHIELD | Admitting: Hematology and Oncology

## 2018-10-17 NOTE — Telephone Encounter (Signed)
Called patient per 1/22 sch message - left message for patient to call back to r/s

## 2018-10-18 ENCOUNTER — Ambulatory Visit (INDEPENDENT_AMBULATORY_CARE_PROVIDER_SITE_OTHER): Payer: BLUE CROSS/BLUE SHIELD | Admitting: Family

## 2018-10-18 ENCOUNTER — Inpatient Hospital Stay: Payer: BLUE CROSS/BLUE SHIELD | Attending: Hematology and Oncology | Admitting: Hematology and Oncology

## 2018-10-18 ENCOUNTER — Encounter: Payer: Self-pay | Admitting: Family

## 2018-10-18 VITALS — BP 136/82 | HR 67 | Temp 99.1°F | Ht 64.0 in | Wt 142.1 lb

## 2018-10-18 DIAGNOSIS — J029 Acute pharyngitis, unspecified: Secondary | ICD-10-CM | POA: Diagnosis not present

## 2018-10-18 MED ORDER — AMOXICILLIN 875 MG PO TABS: 875.0000 mg | ORAL_TABLET | Freq: Two times a day (BID) | ORAL | 0 refills | Status: DC

## 2018-10-18 NOTE — Progress Notes (Signed)
Ariel Tran is a 53 y.o. female with the following history as recorded in EpicCare:  Patient Active Problem List   Diagnosis Date Noted  . Hyperkinetic conduct disorder of childhood 07/23/2018  . ADHD, predominantly inattentive type 06/10/2018  . Acquired absence of breast and nipple, bilateral 04/04/2018  . Breast cancer, left breast ([place]) 03/26/2018  . Genetic testing 02/06/2018  . Family history of breast cancer   . Family history of prostate cancer   . Family history of melanoma   . Malignant neoplasm of upper-inner quadrant of left breast in female, estrogen receptor positive ([place]) 01/09/2018  . Cough 11/03/2017  . Hypothyroidism 10/02/2017  . Acute non-recurrent frontal sinusitis 07/26/2017  . Routine general medical examination at a health care facility 11/19/2014  . Chronic fatigue 10/26/2014  . Xerostomia 10/26/2014    Current Outpatient Medications  Medication Sig Dispense Refill  . amphetamine-dextroamphetamine (ADDERALL) 15 MG tablet Take 1 tablet by mouth 2 (two) times daily. 60 tablet 0  . Multiple Vitamins-Minerals (MULTIVITAMIN WITH MINERALS) tablet Take 1 tablet by mouth daily.    . tamoxifen (NOLVADEX) 10 MG tablet Take 1 tablet (10 mg total) by mouth daily. If tolerates can increase to 2 tablets daily 60 tablet 0  . amoxicillin (AMOXIL) 875 MG tablet Take 1 tablet (875 mg total) by mouth 2 (two) times daily. 20 tablet 0   No current facility-administered medications for this visit.     Allergies: Levothyroxine sodium; Chicken allergy; Citrus; Contrast media [iodinated diagnostic agents]; Eggs or egg-derived products; and Tramadol  Past Medical History:  Diagnosis Date  . Allergy   . Breast cancer ([place]) 01/2018   left breast cancer  . Family history of breast cancer   . Family history of melanoma   . Family history of prostate cancer   . Heart murmur   . Hypothyroidism   . UTI (urinary tract infection)     Past Surgical History:  Procedure Laterality  Date  . BREAST RECONSTRUCTION WITH PLACEMENT OF TISSUE EXPANDER AND ALLODERM Bilateral 03/26/2018   Procedure: BREAST RECONSTRUCTION WITH PLACEMENT OF TISSUE EXPANDER AND ACELLULAR DERMIS;  Surgeon: Irene Limbo, MD;  Location: [facility];  Service: Plastics;  Laterality: Bilateral;  . CESAREAN SECTION    . LIPOSUCTION WITH LIPOFILLING Bilateral 08/20/2018   Procedure: LIPOFILLING FROM ABDOMEN TO BILATERAL CHEST;  Surgeon: Irene Limbo, MD;  Location: [facility];  Service: Plastics;  Laterality: Bilateral;  CASE SHOULD BE 180 MINUTES  . MASTECTOMY W/ SENTINEL NODE BIOPSY Bilateral 03/26/2018   Procedure: BILATERAL MASTECTOMIES WITH LEFT SENTINEL LYMPH NODE BIOPSY;  Surgeon: Stark Klein, MD;  Location: [facility];  Service: General;  Laterality: Bilateral;  . REMOVAL OF BILATERAL TISSUE EXPANDERS WITH PLACEMENT OF BILATERAL BREAST IMPLANTS Bilateral 08/20/2018   Procedure: REMOVAL OF BILATERAL TISSUE EXPANDERS WITH PLACEMENT OF BILATERAL BREAST IMPLANTS;  Surgeon: Irene Limbo, MD;  Location: [facility];  Service: Plastics;  Laterality: Bilateral;  . TUBAL LIGATION      Family History  Problem Relation Age of Onset  . Cancer Mother        lung  . Hyperlipidemia Mother   . Hypothyroidism Mother   . Thyroid disease Mother   . Breast cancer Mother        dx late 42's early 27's  . Prostate cancer Father 76       metastatic  . Diabetes Maternal Uncle   . Thyroid disease Maternal Uncle   . Cancer Paternal  Grandmother        abdominal area  . Diabetes Paternal Grandmother   . Bleeding Disorder Sister   . Sjogren's syndrome Sister   . Thyroid disease Maternal Aunt   . Thyroid disease Maternal Grandmother   . Thyroid disease Maternal Grandfather   . Brain cancer Paternal Aunt 47  . Cancer Paternal Grandfather 37       metastatic to bone, thinks primary may have been prostate  . Melanoma Cousin   . Thyroid cancer  Cousin        had thyroid disease prior  . Lung cancer Cousin     Social History   Tobacco Use  . Smoking status: Never Smoker  . Smokeless tobacco: Never Used  Substance Use Topics  . Alcohol use: Yes    Alcohol/week: 0.0 standard drinks    Comment: social    Subjective:  Patient presents with concerns for possible strep throat; symptoms x 5-6 days; works as a Print production planner; patient notes she is prone to strep throat; started with fever yesterday- woke up this morning "freezing." + right side more problematic than left- hurts to swallow; limited benefit with home remedies;     Objective:  Vitals:   10/18/18 1544  BP: 136/82  Pulse: 67  Temp: 99.1 F (37.3 C)  TempSrc: Oral  SpO2: 97%  Weight: 142 lb 1.3 oz (64.4 kg)  Height: 5\' 4"  (1.626 m)    General: Well developed, well nourished, in no acute distress  Skin : Warm and dry.  Head: Normocephalic and atraumatic  Eyes: Sclera and conjunctiva clear; pupils round and reactive to light; extraocular movements intact  Ears: External normal; canals clear; tympanic membranes normal  Oropharynx: Pink, supple. Tonsils enlarged, limited exam put some exudate seen  Neck: Supple without thyromegaly, adenopathy  Lungs: Respirations unlabored;  Neurologic: Alert and oriented; speech intact; face symmetrical; moves all extremities well; CNII-XII intact without focal deficit   Assessment:  1. Acute pharyngitis, unspecified etiology     Plan:  Rapid strep is negative but appearance in office is concerning; Rx for Amoxicillin 875 mg bid x 10 days; change toothbrush as directed; continue Advil, increase fluids, rest and follow up worse, no better.   No follow-ups on file.  No orders of the defined types were placed in this encounter.   Requested Prescriptions   Signed Prescriptions Disp Refills  . amoxicillin (AMOXIL) 875 MG tablet 20 tablet 0    Sig: Take 1 tablet (875 mg total) by mouth 2 (two) times daily.

## 2018-12-02 ENCOUNTER — Other Ambulatory Visit: Payer: Self-pay

## 2018-12-02 ENCOUNTER — Telehealth: Payer: Self-pay | Admitting: Physician Assistant

## 2018-12-02 MED ORDER — AMPHETAMINE-DEXTROAMPHETAMINE 15 MG PO TABS: 15.0000 mg | ORAL_TABLET | Freq: Two times a day (BID) | ORAL | 0 refills | Status: DC

## 2018-12-02 NOTE — Telephone Encounter (Signed)
Pt needs [facility] [address]. Pt also wants to know what she can do about the price it jumped from $15 to $30

## 2018-12-02 NOTE — Telephone Encounter (Signed)
I sent Rx in.  Nothing that I know of she can do about the price.  Just use [facility]

## 2018-12-03 NOTE — Telephone Encounter (Signed)
Left voicemail with detailed information 

## 2018-12-30 ENCOUNTER — Other Ambulatory Visit: Payer: Self-pay

## 2018-12-30 ENCOUNTER — Telehealth: Payer: Self-pay | Admitting: Physician Assistant

## 2018-12-30 MED ORDER — AMPHETAMINE-DEXTROAMPHETAMINE 15 MG PO TABS: 15.0000 mg | ORAL_TABLET | Freq: Two times a day (BID) | ORAL | 0 refills | Status: DC

## 2018-12-30 NOTE — Telephone Encounter (Signed)
Submitted to provider for approval

## 2018-12-30 NOTE — Telephone Encounter (Signed)
Ariel Tran called to request refill of her Adderall.  Next appt 02/25/19.  Send to [place].

## 2019-01-27 ENCOUNTER — Other Ambulatory Visit: Payer: Self-pay

## 2019-01-27 ENCOUNTER — Telehealth: Payer: Self-pay | Admitting: Physician Assistant

## 2019-01-27 NOTE — Telephone Encounter (Signed)
Ariel Tran called to request refill of her Adderall.  Next appt 02/25/19.  [facility] [address]

## 2019-01-27 NOTE — Telephone Encounter (Signed)
Pended for provider to approve

## 2019-01-28 ENCOUNTER — Other Ambulatory Visit: Payer: Self-pay | Admitting: Physician Assistant

## 2019-01-28 MED ORDER — AMPHETAMINE-DEXTROAMPHETAMINE 15 MG PO TABS: 15.0000 mg | ORAL_TABLET | Freq: Two times a day (BID) | ORAL | 0 refills | Status: DC

## 2019-02-25 ENCOUNTER — Encounter: Payer: Self-pay | Admitting: Physician Assistant

## 2019-02-25 ENCOUNTER — Ambulatory Visit: Payer: BLUE CROSS/BLUE SHIELD | Admitting: Physician Assistant

## 2019-02-25 ENCOUNTER — Other Ambulatory Visit: Payer: Self-pay

## 2019-02-25 DIAGNOSIS — F901 Attention-deficit hyperactivity disorder, predominantly hyperactive type: Secondary | ICD-10-CM | POA: Diagnosis not present

## 2019-02-25 MED ORDER — AMPHETAMINE-DEXTROAMPHETAMINE 30 MG PO TABS: 15.0000 mg | ORAL_TABLET | Freq: Two times a day (BID) | ORAL | 0 refills | Status: DC

## 2019-02-25 NOTE — Progress Notes (Signed)
[facility]  Patient ID: Ariel Tran,  MRN: [id number]  PCP: Hoyt Koch, MD  Date of Evaluation: 02/25/19 Time spent:15 minutes  Chief Complaint:  Chief Complaint    Follow-up      HISTORY/CURRENT STATUS: HPI For 76-month med check for ADD.  Doing well overall.  Feels that the Adderall is still working well.  She is able to concentrate and finish tasks.  She does not get too distracted with her work.  She has had a little more anxiety due to the coronavirus pandemic and the racial tension in the country right now.    Denies dizziness, syncope, seizures, numbness, tingling, tremor, tics, unsteady gait, slurred speech, confusion. Denies muscle or joint pain, stiffness, or dystonia.  Individual Medical History/ Review of Systems: Changes? :No    Past medications for mental health diagnoses include: Adderall, Celexa, Ambien, Xanax, Wellbutrin  Allergies: Levothyroxine sodium; Chicken allergy; Citrus; Contrast media [iodinated diagnostic agents]; Eggs or egg-derived products; and Tramadol  Current Medications:  Current Outpatient Medications:  Marland Kitchen  Multiple Vitamins-Minerals (MULTIVITAMIN WITH MINERALS) tablet, Take 1 tablet by mouth daily., Disp: , Rfl:  .  tamoxifen (NOLVADEX) 10 MG tablet, Take 1 tablet (10 mg total) by mouth daily. If tolerates can increase to 2 tablets daily, Disp: 60 tablet, Rfl: 0 .  amoxicillin (AMOXIL) 875 MG tablet, Take 1 tablet (875 mg total) by mouth 2 (two) times daily., Disp: 20 tablet, Rfl: 0 .  [START ON 04/24/2019] amphetamine-dextroamphetamine (ADDERALL) 30 MG tablet, Take 0.5 tablets by mouth 2 (two) times daily., Disp: 30 tablet, Rfl: 0 .  [START ON 03/26/2019] amphetamine-dextroamphetamine (ADDERALL) 30 MG tablet, Take 0.5 tablets by mouth 2 (two) times daily., Disp: 30 tablet, Rfl: 0 .  amphetamine-dextroamphetamine (ADDERALL) 30 MG tablet, Take 0.5 tablets by mouth 2 (two) times daily., Disp: 30 tablet, Rfl: 0 Medication  Side Effects: none  Family Medical/ Social History: Changes? Yes social distancing d/t coronavirus.  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  She wants to pursue art, which is what her college degree is in.  MENTAL HEALTH EXAM:  There were no vitals taken for this visit.There is no height or weight on file to calculate BMI.  General Appearance: Casual  Eye Contact:  Good  Speech:  Clear and Coherent  Volume:  Normal  Mood:  Euthymic  Affect:  Appropriate  Thought Process:  Goal Directed  Orientation:  Full (Time, Place, and Person)  Thought Content: Logical   Suicidal Thoughts:  No  Homicidal Thoughts:  No  Memory:  WNL  Judgement:  Good  Insight:  Good  Psychomotor Activity:  Normal  Concentration:  Concentration: Good  Recall:  Good  Fund of Knowledge: Good  Language: Good  Assets:  Desire for Improvement  ADL's:  Intact  Cognition: WNL  Prognosis:  Good    DIAGNOSES:    ICD-10-CM   1. Attention deficit hyperactivity disorder (ADHD), predominantly hyperactive type F90.1     Receiving Psychotherapy: No    RECOMMENDATIONS:  Continue Adderall half p.o. twice daily.  PDMP was reviewed. We discussed ways to help cope with the current situation and our country in the world.  Make sure she is getting exercise, eating healthy and getting enough sleep. Good luck on her new endeavor! Return in 6 months.   Donnal Moat, PA-C   This record has been created using [employer].  Chart creation errors have been sought, but may not always have been located and corrected.  Such creation errors do not reflect on the standard of medical care.

## 2019-05-22 ENCOUNTER — Telehealth: Payer: Self-pay | Admitting: Physician Assistant

## 2019-05-22 ENCOUNTER — Other Ambulatory Visit: Payer: Self-pay

## 2019-05-22 MED ORDER — AMPHETAMINE-DEXTROAMPHETAMINE 30 MG PO TABS: 15.0000 mg | ORAL_TABLET | Freq: Two times a day (BID) | ORAL | 0 refills | Status: DC

## 2019-05-22 NOTE — Telephone Encounter (Signed)
Pt called for Adderall refill @ [facility]

## 2019-05-22 NOTE — Telephone Encounter (Signed)
Pended for approval last refill 04/24/2019

## 2019-08-18 ENCOUNTER — Telehealth: Payer: Self-pay | Admitting: Physician Assistant

## 2019-08-18 NOTE — Telephone Encounter (Signed)
PT is requesting Adderall RF. [facility] [address]

## 2019-08-19 ENCOUNTER — Other Ambulatory Visit: Payer: Self-pay

## 2019-08-19 MED ORDER — AMPHETAMINE-DEXTROAMPHETAMINE 30 MG PO TABS: 15.0000 mg | ORAL_TABLET | Freq: Two times a day (BID) | ORAL | 0 refills | Status: DC

## 2019-08-19 NOTE — Telephone Encounter (Signed)
Last refill 10/23 Next apt 08/27/2019 Pended for approval

## 2019-08-26 ENCOUNTER — Ambulatory Visit (INDEPENDENT_AMBULATORY_CARE_PROVIDER_SITE_OTHER): Payer: BLUE CROSS/BLUE SHIELD | Admitting: Internal Medicine

## 2019-08-26 ENCOUNTER — Ambulatory Visit (INDEPENDENT_AMBULATORY_CARE_PROVIDER_SITE_OTHER)
Admission: RE | Admit: 2019-08-26 | Discharge: 2019-08-26 | Disposition: A | Discharge status: Home / Self Care | Payer: BLUE CROSS/BLUE SHIELD | Source: Ambulatory Visit | Attending: Internal Medicine | Admitting: Internal Medicine

## 2019-08-26 ENCOUNTER — Other Ambulatory Visit (INDEPENDENT_AMBULATORY_CARE_PROVIDER_SITE_OTHER): Payer: BLUE CROSS/BLUE SHIELD

## 2019-08-26 ENCOUNTER — Other Ambulatory Visit: Payer: Self-pay

## 2019-08-26 ENCOUNTER — Encounter: Payer: Self-pay | Admitting: Internal Medicine

## 2019-08-26 VITALS — BP 130/90 | HR 92 | Temp 98.8°F | Ht 64.0 in | Wt 142.0 lb

## 2019-08-26 DIAGNOSIS — E039 Hypothyroidism, unspecified: Secondary | ICD-10-CM

## 2019-08-26 DIAGNOSIS — S62635A Displaced fracture of distal phalanx of left ring finger, initial encounter for closed fracture: Secondary | ICD-10-CM | POA: Diagnosis not present

## 2019-08-26 DIAGNOSIS — M79642 Pain in left hand: Secondary | ICD-10-CM

## 2019-08-26 LAB — CBC
HCT: 45 % (ref 36.0–46.0)
Hemoglobin: 15.2 g/dL — ABNORMAL HIGH (ref 12.0–15.0)
MCHC: 33.7 g/dL (ref 30.0–36.0)
MCV: 90.4 fl (ref 78.0–100.0)
Platelets: 243 10*3/uL (ref 150.0–400.0)
RBC: 4.98 Mil/uL (ref 3.87–5.11)
RDW: 14 % (ref 11.5–15.5)
WBC: 4.5 10*3/uL (ref 4.0–10.5)

## 2019-08-26 LAB — COMPREHENSIVE METABOLIC PANEL
ALT: 19 U/L (ref 0–35)
AST: 22 U/L (ref 0–37)
Albumin: 4.8 g/dL (ref 3.5–5.2)
Alkaline Phosphatase: 73 U/L (ref 39–117)
BUN: 16 mg/dL (ref 6–23)
CO2: 25 mEq/L (ref 19–32)
Calcium: 9.6 mg/dL (ref 8.4–10.5)
Chloride: 103 mEq/L (ref 96–112)
Creatinine, Ser: 0.7 mg/dL (ref 0.40–1.20)
GFR: 87.17 mL/min (ref >60.00)
Glucose, Bld: 107 mg/dL — ABNORMAL HIGH (ref 70–99)
Potassium: 3.9 mEq/L (ref 3.5–5.1)
Sodium: 136 mEq/L (ref 135–145)
Total Bilirubin: 0.6 mg/dL (ref 0.2–1.2)
Total Protein: 7.6 g/dL (ref 6.0–8.3)

## 2019-08-26 LAB — LIPID PANEL
Cholesterol: 229 mg/dL — ABNORMAL HIGH (ref 0–200)
HDL: 86.4 mg/dL (ref >39.00)
LDL Cholesterol: 124 mg/dL — ABNORMAL HIGH (ref 0–99)
NonHDL: 142.56
Total CHOL/HDL Ratio: 3
Triglycerides: 94 mg/dL (ref 0.0–149.0)
VLDL: 18.8 mg/dL (ref 0.0–40.0)

## 2019-08-26 LAB — TSH: TSH: 1.01 u[IU]/mL (ref 0.35–4.50)

## 2019-08-26 LAB — T4, FREE: Free T4: 0.75 ng/dL (ref 0.60–1.60)

## 2019-08-26 MED ORDER — CEPHALEXIN 500 MG PO CAPS: 500.0000 mg | ORAL_CAPSULE | Freq: Two times a day (BID) | ORAL | 0 refills | Status: DC

## 2019-08-26 NOTE — Patient Instructions (Signed)
We are checking the labs today and the x-ray.  We have sent in keflex to take 1 pill twice a day for 5 days.

## 2019-08-26 NOTE — Assessment & Plan Note (Signed)
Checking x-ray and suspect there could have been a fracture at time of injury. Given redness and swelling will cover with keflex for infection. Adjust as needed after x-ray.

## 2019-08-26 NOTE — Progress Notes (Signed)
   Subjective:   Patient ID: Ariel Tran, female    DOB: 15-Jun-1966, 53 y.o.   MRN: [id number]  HPI The patient is a 53 YO female coming in for left hand injury. On the ring finger. She was injured breaking up her dog and a pit bull. She may have stubbed the finger into the ground. No bite or direct wound to finger. Hurt severely after the incident This happened on 07/25/19. Took tylenol and/or ibuprofen around the clock. This did get some better but not fully better. There is some deformity at the distal joint. 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 She is getting more pain in the end of the finger and some redness and swelling. Denies direct re-injury.  Review of Systems  Constitutional: Negative.   HENT: Negative.   Eyes: Negative.   Respiratory: Negative for cough, chest tightness and shortness of breath.   Cardiovascular: Negative for chest pain, palpitations and leg swelling.  Gastrointestinal: Negative for abdominal distention, abdominal pain, constipation, diarrhea, nausea and vomiting.  Musculoskeletal: Positive for arthralgias, joint swelling and myalgias.  Skin: Negative.   Neurological: Negative.   Psychiatric/Behavioral: Negative.     Objective:  Physical Exam Constitutional:      Appearance: She is well-developed.  HENT:     Head: Normocephalic and atraumatic.  Neck:     Musculoskeletal: Normal range of motion.  Cardiovascular:     Rate and Rhythm: Normal rate and regular rhythm.  Pulmonary:     Effort: Pulmonary effort is normal. No respiratory distress.     Breath sounds: Normal breath sounds. No wheezing or rales.  Abdominal:     General: Bowel sounds are normal. There is no distension.     Palpations: Abdomen is soft.     Tenderness: There is no abdominal tenderness. There is no rebound.  Musculoskeletal:        General: Tenderness present.     Comments: Distal 4th finger with redness and swelling  Skin:  General: Skin is warm and dry.  Neurological:     Mental Status: She is alert and oriented to person, place, and time.     Coordination: Coordination normal.     Vitals:   08/26/19 1055  BP: 130/90  Pulse: 92  Temp: 98.8 F (37.1 C)  TempSrc: Oral  SpO2: 99%  Weight: 142 lb (64.4 kg)  Height: 5\' 4"  (1.626 m)    This visit occurred during the SARS-CoV-2 public health emergency.  Safety protocols were in place, including screening questions prior to the visit, additional usage of staff PPE, and extensive cleaning of exam room while observing appropriate contact time as indicated for disinfecting solutions.   Assessment & Plan:

## 2019-08-27 ENCOUNTER — Ambulatory Visit: Payer: BLUE CROSS/BLUE SHIELD | Admitting: Physician Assistant

## 2019-09-15 ENCOUNTER — Other Ambulatory Visit: Payer: Self-pay

## 2019-09-15 ENCOUNTER — Telehealth: Payer: Self-pay | Admitting: Physician Assistant

## 2019-09-15 MED ORDER — AMPHETAMINE-DEXTROAMPHETAMINE 30 MG PO TABS: 15.0000 mg | ORAL_TABLET | Freq: Two times a day (BID) | ORAL | 0 refills | Status: DC

## 2019-09-15 NOTE — Telephone Encounter (Signed)
Last refill 08/19/2019 Pended for approval

## 2019-09-15 NOTE — Telephone Encounter (Signed)
Pt requested refill for Adderall @ [facility] [place]. Next Appt 1/18

## 2019-10-13 ENCOUNTER — Encounter: Payer: Self-pay | Admitting: Physician Assistant

## 2019-10-13 ENCOUNTER — Ambulatory Visit (INDEPENDENT_AMBULATORY_CARE_PROVIDER_SITE_OTHER): Payer: BLUE CROSS/BLUE SHIELD | Admitting: Physician Assistant

## 2019-10-13 ENCOUNTER — Other Ambulatory Visit: Payer: Self-pay

## 2019-10-13 VITALS — BP 142/100 | HR 102

## 2019-10-13 DIAGNOSIS — F901 Attention-deficit hyperactivity disorder, predominantly hyperactive type: Secondary | ICD-10-CM | POA: Diagnosis not present

## 2019-10-13 MED ORDER — AMPHETAMINE-DEXTROAMPHETAMINE 30 MG PO TABS: 15.0000 mg | ORAL_TABLET | Freq: Two times a day (BID) | ORAL | 0 refills | Status: DC

## 2019-10-13 NOTE — Progress Notes (Signed)
[facility]  Patient ID: Ariel Tran,  MRN: [id number]  PCP: Hoyt Koch, MD  Date of Evaluation: 10/13/2019 Time spent:20 minutes  Chief Complaint:  Chief Complaint    ADHD; Follow-up      HISTORY/CURRENT STATUS: HPI For routine med check.   Medicine is working well.  Has been more stressed b/c of change in job. 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  She has been working with [employer] for about 3 months now.  She likes it but it is stressful.  States that attention is good without easy distractibility.  Able to focus on things and finish tasks to completion.  Feels that the Adderall is still working well.  There are times though when she feels that it does not last long enough.  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   Patient denies loss of interest in usual activities and is able to enjoy things.  Denies decreased energy or motivation.  Appetite has not changed.  No extreme sadness, tearfulness, or feelings of hopelessness.  Denies any changes in concentration, making decisions or remembering things.  Denies suicidal or homicidal thoughts.  Denies dizziness, syncope, seizures, numbness, tingling, tremor, tics, unsteady gait, slurred speech, confusion. Denies muscle or joint pain, stiffness, or dystonia.  Individual Medical History/ Review of Systems: Changes? :No   Allergies: Levothyroxine sodium, Chicken allergy, Citrus, Contrast media [iodinated diagnostic agents], Eggs or egg-derived products, and Tramadol  Current Medications:  Current Outpatient Medications:  .  [START ON 12/09/2019] amphetamine-dextroamphetamine (ADDERALL) 30 MG tablet, Take 0.5 tablets by mouth 2 (two) times daily., Disp: 30 tablet, Rfl: 0 .  Multiple Vitamins-Minerals (MULTIVITAMIN WITH MINERALS) tablet, Take 1 tablet by mouth daily., Disp: , Rfl:  .  [START ON 11/12/2019] amphetamine-dextroamphetamine (ADDERALL) 30 MG tablet, Take 0.5  tablets by mouth 2 (two) times daily., Disp: 30 tablet, Rfl: 0 .  amphetamine-dextroamphetamine (ADDERALL) 30 MG tablet, Take 0.5 tablets by mouth 2 (two) times daily., Disp: 30 tablet, Rfl: 0 .  cephALEXin (KEFLEX) 500 MG capsule, Take 1 capsule (500 mg total) by mouth 2 (two) times daily. (Patient not taking: Reported on 10/13/2019), Disp: 10 capsule, Rfl: 0 .  tamoxifen (NOLVADEX) 10 MG tablet, Take 1 tablet (10 mg total) by mouth daily. If tolerates can increase to 2 tablets daily (Patient not taking: Reported on 08/26/2019), Disp: 60 tablet, Rfl: 0 Medication Side Effects: none  Family Medical/ Social History: Changes? Yes now working for the [employer].   MENTAL HEALTH EXAM:  Blood pressure (!) 142/100, pulse (!) 102.There is no height or weight on file to calculate BMI.  General Appearance: Casual, Neat and Well Groomed  Eye Contact:  Good  Speech:  Clear and Coherent  Volume:  Normal  Mood:  Euthymic  Affect:  Appropriate  Thought Process:  Goal Directed and Descriptions of Associations: Intact  Orientation:  Full (Time, Place, and Person)  Thought Content: Logical   Suicidal Thoughts:  No  Homicidal Thoughts:  No  Memory:  WNL  Judgement:  Good  Insight:  Good  Psychomotor Activity:  Normal  Concentration:  Concentration: Good and Attention Span: Good  Recall:  Good  Fund of Knowledge: Good  Language: Good  Assets:  Desire for Improvement  ADL's:  Intact  Cognition: WNL  Prognosis:  Good    DIAGNOSES:    ICD-10-CM   1. Attention deficit hyperactivity disorder (ADHD), predominantly hyperactive type  F90.1  Receiving Psychotherapy: No    RECOMMENDATIONS:  PDMP reviewed.  Continue Adderall 30 mg, 1/2 po bid.  We discussed possibly going up on the Adderall.  She can call if she wants to try that.  We would increase to Adderall 20 mg p.o. twice daily. Return in 6 months.  Donnal Moat, PA-C

## 2020-01-07 ENCOUNTER — Telehealth: Payer: Self-pay | Admitting: Physician Assistant

## 2020-01-07 ENCOUNTER — Other Ambulatory Visit: Payer: Self-pay

## 2020-01-07 MED ORDER — AMPHETAMINE-DEXTROAMPHETAMINE 30 MG PO TABS: 15.0000 mg | ORAL_TABLET | Freq: Two times a day (BID) | ORAL | 0 refills | Status: DC

## 2020-01-07 NOTE — Telephone Encounter (Signed)
Last refill 12/08/2019, pended 3 Rx's for Ariel Tran to submit Next apt is in July 2021

## 2020-01-07 NOTE — Telephone Encounter (Signed)
Pt would like a refill on Adderall. [facility] [address]

## 2020-01-08 ENCOUNTER — Other Ambulatory Visit: Payer: Self-pay

## 2020-01-08 MED ORDER — AMPHETAMINE-DEXTROAMPHETAMINE 30 MG PO TABS: 15.0000 mg | ORAL_TABLET | Freq: Two times a day (BID) | ORAL | 0 refills | Status: DC

## 2020-01-08 NOTE — Telephone Encounter (Signed)
[facility] called back it is the [address]

## 2020-01-08 NOTE — Telephone Encounter (Signed)
Pended Rx to be submitted to [facility] [address]

## 2020-01-08 NOTE — Telephone Encounter (Signed)
Waiting for patient to call back about which [facility] [address] she is requesting, either [address]

## 2020-01-08 NOTE — Telephone Encounter (Signed)
Pt left message asking to send Rx to [facility] [address]. [facility] Del does not have meds.Called pt back to get correct [facility]  [address]. Advise asap

## 2020-03-31 ENCOUNTER — Telehealth: Payer: Self-pay | Admitting: Physician Assistant

## 2020-03-31 ENCOUNTER — Other Ambulatory Visit: Payer: Self-pay | Admitting: Physician Assistant

## 2020-03-31 MED ORDER — AMPHETAMINE-DEXTROAMPHETAMINE 20 MG PO TABS: 20.0000 mg | ORAL_TABLET | Freq: Two times a day (BID) | ORAL | 0 refills | Status: DC

## 2020-03-31 NOTE — Telephone Encounter (Signed)
Pt is requesting a RF of her Adderall. She is asking if you can adjust the dose higher or differently so that it helps her focus for longer in the day. Needs it to last longer but doesn't want it too strong. [facility] [address] [place]

## 2020-03-31 NOTE — Telephone Encounter (Signed)
LM with information and to call back with questions or concerns.

## 2020-03-31 NOTE — Telephone Encounter (Signed)
I increased the dose up to 20 mg, 1 p.o. twice daily.

## 2020-04-12 ENCOUNTER — Ambulatory Visit: Payer: BLUE CROSS/BLUE SHIELD | Admitting: Physician Assistant

## 2020-04-29 ENCOUNTER — Ambulatory Visit: Payer: BLUE CROSS/BLUE SHIELD | Admitting: Physician Assistant

## 2020-04-30 ENCOUNTER — Telehealth: Payer: Self-pay | Admitting: Physician Assistant

## 2020-04-30 ENCOUNTER — Other Ambulatory Visit: Payer: Self-pay

## 2020-04-30 MED ORDER — AMPHETAMINE-DEXTROAMPHETAMINE 20 MG PO TABS: 20.0000 mg | ORAL_TABLET | Freq: Two times a day (BID) | ORAL | 0 refills | Status: DC

## 2020-04-30 NOTE — Telephone Encounter (Signed)
Last refill 03/31/2020 Pended for Helene Kelp Has upcoming apt 05/21/20

## 2020-04-30 NOTE — Telephone Encounter (Signed)
Ariel Tran called to request of her Adderall.  Appt 8/27.  [facility] [address]

## 2020-05-21 ENCOUNTER — Telehealth: Payer: Self-pay | Admitting: Physician Assistant

## 2020-05-21 ENCOUNTER — Telehealth (INDEPENDENT_AMBULATORY_CARE_PROVIDER_SITE_OTHER): Payer: 59 | Admitting: Physician Assistant

## 2020-05-21 ENCOUNTER — Encounter: Payer: Self-pay | Admitting: Physician Assistant

## 2020-05-21 DIAGNOSIS — F901 Attention-deficit hyperactivity disorder, predominantly hyperactive type: Secondary | ICD-10-CM

## 2020-05-21 MED ORDER — AMPHETAMINE-DEXTROAMPHETAMINE 20 MG PO TABS: 20.0000 mg | ORAL_TABLET | Freq: Two times a day (BID) | ORAL | 0 refills | Status: DC

## 2020-05-21 NOTE — Telephone Encounter (Signed)
Ms. Ariel Tran, Ariel Tran are scheduled for a virtual visit with your provider today.    Just as we do with appointments in the office, we must obtain your consent to participate.  Your consent will be active for this visit and any virtual visit you may have with one of our providers in the next 365 days.    If you have a MyChart account, I can also send a copy of this consent to you electronically.  All virtual visits are billed to your insurance company just like a traditional visit in the office.  As this is a virtual visit, video technology does not allow for your provider to perform a traditional examination.  This may limit your provider's ability to fully assess your condition.  If your provider identifies any concerns that need to be evaluated in person or the need to arrange testing such as labs, EKG, etc, we will make arrangements to do so.    Although advances in technology are sophisticated, we cannot ensure that it will always work on either your end or our end.  If the connection with a video visit is poor, we may have to switch to a telephone visit.  With either a video or telephone visit, we are not always able to ensure that we have a secure connection.   I need to obtain your verbal consent now.   Are you willing to proceed with your visit today?   Ariel Tran has provided verbal consent on 05/21/2020 for a virtual visit (video or telephone).   Donnal Moat, PA-C 05/21/2020  12:02 PM

## 2020-05-21 NOTE — Progress Notes (Signed)
[facility]  Patient ID: Ariel Tran,  MRN: [id number]  PCP: Hoyt Koch, MD  Date of Evaluation: 05/21/2020 Time spent:20 minutes  Chief Complaint:  Chief Complaint    ADD     Virtual Visit via Telehealth  I connected with patient by a video enabled telemedicine application with their informed consent, and verified patient privacy and that I am speaking with the correct person using two identifiers.  I am private, in my office and the patient is at work.  I discussed the limitations, risks, security and privacy concerns of performing an evaluation and management service by video and the availability of in person appointments. I also discussed with the patient that there may be a patient responsible charge related to this service. The patient expressed understanding and agreed to proceed.   I discussed the assessment and treatment plan with the patient. The patient was provided an opportunity to ask questions and all were answered. The patient agreed with the plan and demonstrated an understanding of the instructions.   The patient was advised to call back or seek an in-person evaluation if the symptoms worsen or if the condition fails to improve as anticipated.  I provided 20 minutes of non-face-to-face time during this encounter.  HISTORY/CURRENT STATUS: HPI For routine med check.   Medicine is working well.  She is able to focus and do her job better than she would if she did not take the Adderall.  It is the last visit, we changed the dose from a total of 30 mg up to 40 mg.  She is now taking 20 mg every morning and 10 to 20 mg in the afternoon depending on her needs.  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  Overall, she is doing really well.  Energy and motivation are good.  She is enjoying things.  Not isolating.  No SI/HI.  Denies dizziness, syncope, seizures,  numbness, tingling, tremor, tics, unsteady gait, slurred speech, confusion. Denies muscle or joint pain, stiffness, or dystonia.  Individual Medical History/ Review of Systems: Changes? :Yes She stopped taking the tamoxifen.  It was causing a lot of joint pain and muscle aches.  She discussed it with her oncologist and her son who is a Software engineer and they all decided for her to stop due to the side effects.  Allergies: Levothyroxine sodium, Chicken allergy, Citrus, Contrast media [iodinated diagnostic agents], Eggs or egg-derived products, and Tramadol  Current Medications:  Current Outpatient Medications:  .  [START ON 07/28/2020] amphetamine-dextroamphetamine (ADDERALL) 20 MG tablet, Take 1 tablet (20 mg total) by mouth 2 (two) times daily., Disp: 60 tablet, Rfl: 0 .  Multiple Vitamins-Minerals (MULTIVITAMIN WITH MINERALS) tablet, Take 1 tablet by mouth daily., Disp: , Rfl:  .  [START ON 06/28/2020] amphetamine-dextroamphetamine (ADDERALL) 20 MG tablet, Take 1 tablet (20 mg total) by mouth 2 (two) times daily., Disp: 60 tablet, Rfl: 0 .  [START ON 05/30/2020] amphetamine-dextroamphetamine (ADDERALL) 20 MG tablet, Take 1 tablet (20 mg total) by mouth 2 (two) times daily., Disp: 60 tablet, Rfl: 0 .  cephALEXin (KEFLEX) 500 MG capsule, Take 1 capsule (500 mg total) by mouth 2 (two) times daily. (Patient not taking: Reported on 10/13/2019), Disp: 10 capsule, Rfl: 0 .  tamoxifen (NOLVADEX) 10 MG tablet, Take 1 tablet (10 mg total) by mouth daily. If tolerates can increase to 2 tablets daily (Patient not taking:  Reported on 08/26/2019), Disp: 60 tablet, Rfl: 0 Medication Side Effects: none  Family Medical/ Social History: Changes? No  MENTAL HEALTH EXAM:  There were no vitals taken for this visit.There is no height or weight on file to calculate BMI.  General Appearance: Casual, Neat and Well Groomed  Eye Contact:  Good  Speech:  Clear and Coherent  Volume:  Normal  Mood:  Euthymic  Affect:   Appropriate  Thought Process:  Goal Directed and Descriptions of Associations: Intact  Orientation:  Full (Time, Place, and Person)  Thought Content: Logical   Suicidal Thoughts:  No  Homicidal Thoughts:  No  Memory:  WNL  Judgement:  Good  Insight:  Good  Psychomotor Activity:  Normal  Concentration:  Concentration: Good and Attention Span: Good  Recall:  Good  Fund of Knowledge: Good  Language: Good  Assets:  Desire for Improvement  ADL's:  Intact  Cognition: WNL  Prognosis:  Good    DIAGNOSES:    ICD-10-CM   1. Attention deficit hyperactivity disorder (ADHD), predominantly hyperactive type  F90.1     Receiving Psychotherapy: No    RECOMMENDATIONS:  PDMP reviewed.  Continue Adderall 20 mg, 1 p.o. every morning and 1/2-1 p.o. q. afternoon as needed.   Return in 6 months.  Donnal Moat, PA-C

## 2020-08-25 ENCOUNTER — Telehealth: Payer: Self-pay | Admitting: Physician Assistant

## 2020-08-25 ENCOUNTER — Other Ambulatory Visit: Payer: Self-pay

## 2020-08-25 MED ORDER — AMPHETAMINE-DEXTROAMPHETAMINE 20 MG PO TABS: 20.0000 mg | ORAL_TABLET | Freq: Two times a day (BID) | ORAL | 0 refills | Status: DC

## 2020-08-25 NOTE — Telephone Encounter (Signed)
Pt called and said that she needs a refill on her adderall 20 mg [facility] [address]

## 2020-08-25 NOTE — Telephone Encounter (Signed)
Last refill 07/28/20 Pended 3 separate Rx's for Ariel Tran to send

## 2020-10-01 ENCOUNTER — Encounter: Payer: Self-pay | Admitting: Internal Medicine

## 2020-10-01 ENCOUNTER — Telehealth (INDEPENDENT_AMBULATORY_CARE_PROVIDER_SITE_OTHER): Payer: BLUE CROSS/BLUE SHIELD | Admitting: Internal Medicine

## 2020-10-01 DIAGNOSIS — R220 Localized swelling, mass and lump, head: Secondary | ICD-10-CM

## 2020-10-01 DIAGNOSIS — Z20822 Contact with and (suspected) exposure to covid-19: Secondary | ICD-10-CM

## 2020-10-01 MED ORDER — EPINEPHRINE 0.3 MG/0.3ML IJ SOAJ: 0.3000 mg | INTRAMUSCULAR | 1 refills | Status: AC | PRN

## 2020-10-01 NOTE — Assessment & Plan Note (Signed)
Sounds to be allergic in nature possibly to the new hand cream. Rx epi-pen and advised to avoid this cream. Will test for covid-19 for congestion and exposure.

## 2020-10-01 NOTE — Progress Notes (Signed)
Pt scheduled for 0930 for Covid test.  Arrival instructions given & pt verb understanding.

## 2020-10-01 NOTE — Progress Notes (Signed)
Virtual Visit via Video Note  I connected with Ariel Tran on 10/01/20 at  8:00 AM EST by a video enabled telemedicine application and verified that I am speaking with the correct person using two identifiers.  The patient and the provider were at separate locations throughout the entire encounter. Patient location: home, Provider location: work   I discussed the limitations of evaluation and management by telemedicine and the availability of in person appointments. The patient expressed understanding and agreed to proceed. The patient and the provider were the only parties present for the visit unless noted in HPI below.  History of Present Illness: The patient is a 55 y.o. female with visit for covid-19 exposure as well as new allergic reaction. Started yesterday with swelling of the lips and hives/rash on the neck. She did not have throat swelling or closing. Denies SOB or breathing difficulty. She did take benadryl and is feeling mostly better today. Lip still feeling slightly abnormal but rash/hives gone. Only new food/product is a hand cream that she has used on hands and face to help with dry skin. Overall it is improving. Was exposed to son who come up positive for covid-19 over the holidays and feels she needs a test. Does have some mild sinus congestion. Moderna 1 shot only.   Observations/Objective: Appearance: normal, breathing appears normal, lips without swelling, casual grooming, abdomen does not appear distended, throat normal, no swelling oropharynx, mental status is A and O times 3  Assessment and Plan: See problem oriented charting  Follow Up Instructions: covid-19 test today, rx epi-pen to use in future if needed, avoid that hand cream product  I discussed the assessment and treatment plan with the patient. The patient was provided an opportunity to ask questions and all were answered. The patient agreed with the plan and demonstrated an understanding of the instructions.    The patient was advised to call back or seek an in-person evaluation if the symptoms worsen or if the condition fails to improve as anticipated.  Hoyt Koch, MD

## 2020-10-05 LAB — NOVEL CORONAVIRUS, NAA: SARS-CoV-2, NAA: NOT DETECTED

## 2020-10-18 ENCOUNTER — Encounter: Payer: Self-pay | Admitting: Internal Medicine

## 2020-10-18 DIAGNOSIS — T783XXD Angioneurotic edema, subsequent encounter: Secondary | ICD-10-CM

## 2020-11-16 ENCOUNTER — Ambulatory Visit: Payer: 59 | Admitting: Physician Assistant

## 2020-11-16 ENCOUNTER — Encounter: Payer: BLUE CROSS/BLUE SHIELD | Admitting: Family Medicine

## 2020-11-16 NOTE — Progress Notes (Signed)
Thought she was setting up an appt w her PCP Decided to call their office and not do this appt.   This encounter was created in error - please disregard.

## 2020-11-18 ENCOUNTER — Other Ambulatory Visit: Payer: Self-pay

## 2020-11-18 ENCOUNTER — Ambulatory Visit (INDEPENDENT_AMBULATORY_CARE_PROVIDER_SITE_OTHER): Payer: BLUE CROSS/BLUE SHIELD | Admitting: Physician Assistant

## 2020-11-18 ENCOUNTER — Encounter: Payer: Self-pay | Admitting: Physician Assistant

## 2020-11-18 DIAGNOSIS — F901 Attention-deficit hyperactivity disorder, predominantly hyperactive type: Secondary | ICD-10-CM

## 2020-11-18 DIAGNOSIS — F4321 Adjustment disorder with depressed mood: Secondary | ICD-10-CM | POA: Diagnosis not present

## 2020-11-18 MED ORDER — AMPHETAMINE-DEXTROAMPHETAMINE 20 MG PO TABS: 20.0000 mg | ORAL_TABLET | Freq: Two times a day (BID) | ORAL | 0 refills | Status: DC

## 2020-11-18 NOTE — Progress Notes (Signed)
[facility]  Patient ID: Ariel Tran,  MRN: [id number]  PCP: Hoyt Koch, MD  Date of Evaluation: 11/18/2020 Time spent:20 minutes  Chief Complaint:  Chief Complaint    ADD      HISTORY/CURRENT STATUS: HPI For routine med check.   Doing well except sadly they had to put their dog to sleep last month.  He had a very aggressive form of cancer that started as a tumor on his face.  He is the dog that pawed and pawed at her chest, over and over several years ago and when she had a mammogram, she was found to have breast cancer.  After her surgery, he never pawed at her chest again.  That was a very tough loss but she has never been without a dog and already has another Rhodesian Ridgeback picked out they will be getting in a couple of months.  Patient denies loss of interest in usual activities and is able to enjoy things.  Denies decreased energy or motivation.  Appetite has not changed.  No extreme sadness, tearfulness, or feelings of hopelessness.  Denies any changes in concentration, making decisions or remembering things.  Denies suicidal or homicidal thoughts.  States that attention is good without easy distractibility.  Able to focus on things and finish tasks to completion.   Denies dizziness, syncope, seizures, numbness, tingling, tremor, tics, unsteady gait, slurred speech, confusion. Denies muscle or joint pain, stiffness, or dystonia.  Individual Medical History/ Review of Systems: Changes? :Yes  Hives, has seen an allergist.   Allergies: Levothyroxine sodium, Chicken allergy, Citrus, Contrast media [iodinated diagnostic agents], Eggs or egg-derived products, and Tramadol  Current Medications:  Current Outpatient Medications:  .  EPINEPHrine 0.3 mg/0.3 mL IJ SOAJ injection, Inject 0.3 mg into the muscle as needed for anaphylaxis., Disp: 1 each, Rfl: 1 .  fexofenadine (ALLEGRA) 180 MG tablet, Take 180 mg by mouth daily., Disp: , Rfl:  .  Multiple  Vitamins-Minerals (MULTIVITAMIN WITH MINERALS) tablet, Take 1 tablet by mouth daily., Disp: , Rfl:  .  [START ON 01/17/2021] amphetamine-dextroamphetamine (ADDERALL) 20 MG tablet, Take 1 tablet (20 mg total) by mouth 2 (two) times daily., Disp: 60 tablet, Rfl: 0 .  [START ON 12/18/2020] amphetamine-dextroamphetamine (ADDERALL) 20 MG tablet, Take 1 tablet (20 mg total) by mouth 2 (two) times daily., Disp: 60 tablet, Rfl: 0 .  [START ON 11/21/2020] amphetamine-dextroamphetamine (ADDERALL) 20 MG tablet, Take 1 tablet (20 mg total) by mouth 2 (two) times daily., Disp: 60 tablet, Rfl: 0 .  tamoxifen (NOLVADEX) 10 MG tablet, Take 1 tablet (10 mg total) by mouth daily. If tolerates can increase to 2 tablets daily (Patient not taking: No sig reported), Disp: 60 tablet, Rfl: 0 Medication Side Effects: none  Family Medical/ Social History: Changes? Her dog had to be put down in January. He is the dog   MENTAL HEALTH EXAM:  There were no vitals taken for this visit.There is no height or weight on file to calculate BMI.  General Appearance: Casual, Neat and Well Groomed  Eye Contact:  Good  Speech:  Clear and Coherent  Volume:  Normal  Mood:  Euthymic  Affect:  Appropriate  Thought Process:  Goal Directed and Descriptions of Associations: Intact  Orientation:  Full (Time, Place, and Person)  Thought Content: Logical   Suicidal Thoughts:  No  Homicidal Thoughts:  No  Memory:  WNL  Judgement:  Good  Insight:  Good  Psychomotor Activity:  Normal  Concentration:  Concentration: Good and Attention Span: Good  Recall:  Good  Fund of Knowledge: Good  Language: Good  Assets:  Desire for Improvement  ADL's:  Intact  Cognition: WNL  Prognosis:  Good    DIAGNOSES:    ICD-10-CM   1. Attention deficit hyperactivity disorder (ADHD), predominantly hyperactive type  F90.1   2. Feeling grief  F43.21     Receiving Psychotherapy: No    RECOMMENDATIONS:  PDMP reviewed.  I provided 20 minutes of  face-to-face time during this encounter, in which we discussed her response to the Adderall.  She is doing well so no changes will be made. My condolences over the loss of her dog. Continue Adderall 20 mg, 1 p.o. every morning and 1/2-1 p.o. q. afternoon as needed.   Return in 6 months.  Donnal Moat, PA-C

## 2021-02-15 ENCOUNTER — Other Ambulatory Visit: Payer: Self-pay

## 2021-02-15 ENCOUNTER — Telehealth: Payer: Self-pay | Admitting: Physician Assistant

## 2021-02-15 MED ORDER — AMPHETAMINE-DEXTROAMPHETAMINE 20 MG PO TABS: 20.0000 mg | ORAL_TABLET | Freq: Two times a day (BID) | ORAL | 0 refills | Status: DC

## 2021-02-15 NOTE — Telephone Encounter (Signed)
Patient Ariel Tran requesting a refill on the Adderall. Ariel Tran was last seen on 2/24 with a follow up scheduled for 8/24.

## 2021-02-15 NOTE — Telephone Encounter (Signed)
pended

## 2021-05-16 ENCOUNTER — Other Ambulatory Visit: Payer: Self-pay

## 2021-05-16 ENCOUNTER — Telehealth: Payer: Self-pay | Admitting: Physician Assistant

## 2021-05-16 NOTE — Telephone Encounter (Signed)
Pended.

## 2021-05-16 NOTE — Telephone Encounter (Signed)
Next visit is 05/19/21. Requesting refill on Generic Adderall 20 mg called to:  [id number] [address] Phone:  [phone] Fax:  [phone]

## 2021-05-16 NOTE — Telephone Encounter (Signed)
Pt. Would like refill on Adderall called in before next appt if possible (next Appt is 05/19/21)  [facility]s [facility]: [id number] [id number]

## 2021-05-17 MED ORDER — AMPHETAMINE-DEXTROAMPHETAMINE 20 MG PO TABS: 20.0000 mg | ORAL_TABLET | Freq: Two times a day (BID) | ORAL | 0 refills | Status: DC

## 2021-05-18 ENCOUNTER — Ambulatory Visit: Payer: BLUE CROSS/BLUE SHIELD | Admitting: Physician Assistant

## 2021-05-19 ENCOUNTER — Other Ambulatory Visit: Payer: Self-pay

## 2021-05-19 ENCOUNTER — Encounter: Payer: Self-pay | Admitting: Physician Assistant

## 2021-05-19 ENCOUNTER — Ambulatory Visit (INDEPENDENT_AMBULATORY_CARE_PROVIDER_SITE_OTHER): Payer: BLUE CROSS/BLUE SHIELD | Admitting: Physician Assistant

## 2021-05-19 DIAGNOSIS — F901 Attention-deficit hyperactivity disorder, predominantly hyperactive type: Secondary | ICD-10-CM | POA: Diagnosis not present

## 2021-05-19 MED ORDER — AMPHETAMINE-DEXTROAMPHETAMINE 20 MG PO TABS: 20.0000 mg | ORAL_TABLET | Freq: Two times a day (BID) | ORAL | 0 refills | Status: DC

## 2021-05-19 NOTE — Progress Notes (Signed)
[facility]  Patient ID: Ariel Tran,  MRN: [id number]  PCP: Hoyt Koch, MD  Date of Evaluation: 05/19/2021  time spent:20 minutes  Chief Complaint:  Chief Complaint   ADHD; Follow-up     HISTORY/CURRENT STATUS: HPI For routine med check.   Meds are still working. States that attention is good without easy distractibility.  Able to focus on things and finish tasks to completion.   Patient denies loss of interest in usual activities and is able to enjoy things.  Denies decreased energy or motivation.  Appetite has not changed.  No extreme sadness, tearfulness, or feelings of hopelessness.  Denies any changes in concentration, making decisions or remembering things.  Denies suicidal or homicidal thoughts.  Denies dizziness, syncope, seizures, numbness, tingling, tremor, tics, unsteady gait, slurred speech, confusion. Denies muscle or joint pain, stiffness, or dystonia.  Individual Medical History/ Review of Systems: Changes? :No      Allergies: Levothyroxine sodium, Chicken allergy, Citrus, Contrast media [iodinated diagnostic agents], Eggs or egg-derived products, and Tramadol  Current Medications:  Current Outpatient Medications:    EPINEPHrine 0.3 mg/0.3 mL IJ SOAJ injection, Inject 0.3 mg into the muscle as needed for anaphylaxis., Disp: 1 each, Rfl: 1   fexofenadine (ALLEGRA) 180 MG tablet, Take 180 mg by mouth daily., Disp: , Rfl:    Multiple Vitamins-Minerals (MULTIVITAMIN WITH MINERALS) tablet, Take 1 tablet by mouth daily., Disp: , Rfl:    [START ON 08/13/2021] amphetamine-dextroamphetamine (ADDERALL) 20 MG tablet, Take 1 tablet (20 mg total) by mouth 2 (two) times daily., Disp: 60 tablet, Rfl: 0   [START ON 07/15/2021] amphetamine-dextroamphetamine (ADDERALL) 20 MG tablet, Take 1 tablet (20 mg total) by mouth 2 (two) times daily., Disp: 60 tablet, Rfl: 0   [START ON 06/16/2021] amphetamine-dextroamphetamine (ADDERALL) 20 MG tablet, Take 1 tablet  (20 mg total) by mouth 2 (two) times daily., Disp: 60 tablet, Rfl: 0   tamoxifen (NOLVADEX) 10 MG tablet, Take 1 tablet (10 mg total) by mouth daily. If tolerates can increase to 2 tablets daily (Patient not taking: No sig reported), Disp: 60 tablet, Rfl: 0 Medication Side Effects: none  Family Medical/ Social History: Changes? Expecting her first grandchild in  Mom has cancer, she went to see her in [place] a few weeks ago. She has 2 new dogs.    MENTAL HEALTH EXAM:  There were no vitals taken for this visit.There is no height or weight on file to calculate BMI.  General Appearance: Casual, Neat and Well Groomed  Eye Contact:  Good  Speech:  Clear and Coherent  Volume:  Normal  Mood:  Euthymic  Affect:  Appropriate  Thought Process:  Goal Directed and Descriptions of Associations: Circumstantial  Orientation:  Full (Time, Place, and Person)  Thought Content: Logical   Suicidal Thoughts:  No  Homicidal Thoughts:  No  Memory:  WNL  Judgement:  Good  Insight:  Good  Psychomotor Activity:  Normal  Concentration:  Concentration: Good and Attention Span: Good  Recall:  Good  Fund of Knowledge: Good  Language: Good  Assets:  Desire for Improvement  ADL's:  Intact  Cognition: WNL  Prognosis:  Good    DIAGNOSES:    ICD-10-CM   1. Attention deficit hyperactivity disorder (ADHD), predominantly hyperactive type  F90.1        Receiving Psychotherapy: No    RECOMMENDATIONS:  PDMP reviewed.  Last Adderall filled 05/17/2021 I provided    minutes of face to face time during this encounter,  including time spent before and after the visit in records review, medical decision making, and charting.  Doing well so no med changes are needed.  Continue Adderall 20 mg, 1 p.o. every morning and 1/2-1 p.o. q. afternoon as needed.   Return in 6 months.  Donnal Moat, PA-C

## 2021-09-12 ENCOUNTER — Other Ambulatory Visit: Payer: Self-pay | Admitting: Physician Assistant

## 2021-09-12 MED ORDER — AMPHETAMINE-DEXTROAMPHETAMINE 20 MG PO TABS: 20.0000 mg | ORAL_TABLET | Freq: Two times a day (BID) | ORAL | 0 refills | Status: DC

## 2021-09-12 NOTE — Telephone Encounter (Signed)
Ariel Tran called to request refill of her Adderall.  [facility] [place]  She did verify that they have it.

## 2021-10-03 IMAGING — DX DG HAND COMPLETE 3+V*L*
3 series · 3 of 3 positions shown · non-contrast
Comparison: None.

CLINICAL DATA: Pain and swelling to fourth finger, LEFT hand injury
1 month ago.

EXAM:
LEFT HAND - COMPLETE 3+ VIEW

[hand ap]
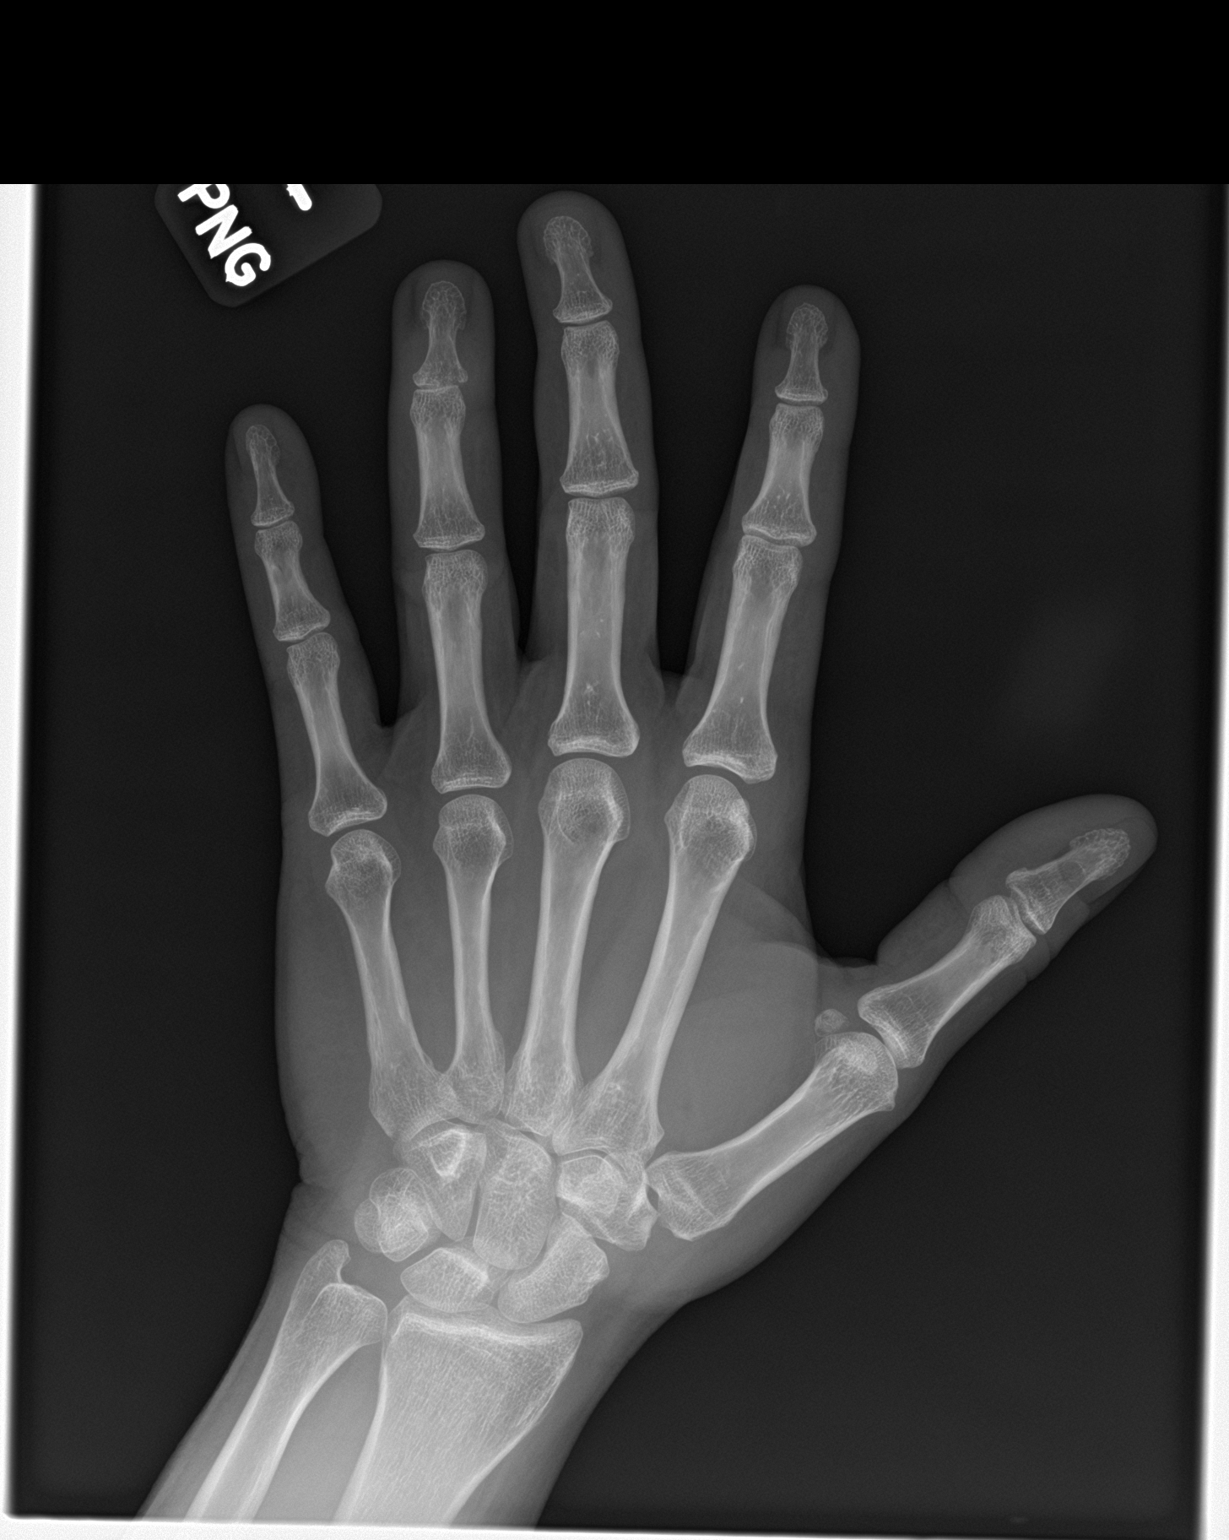

[hand obl]
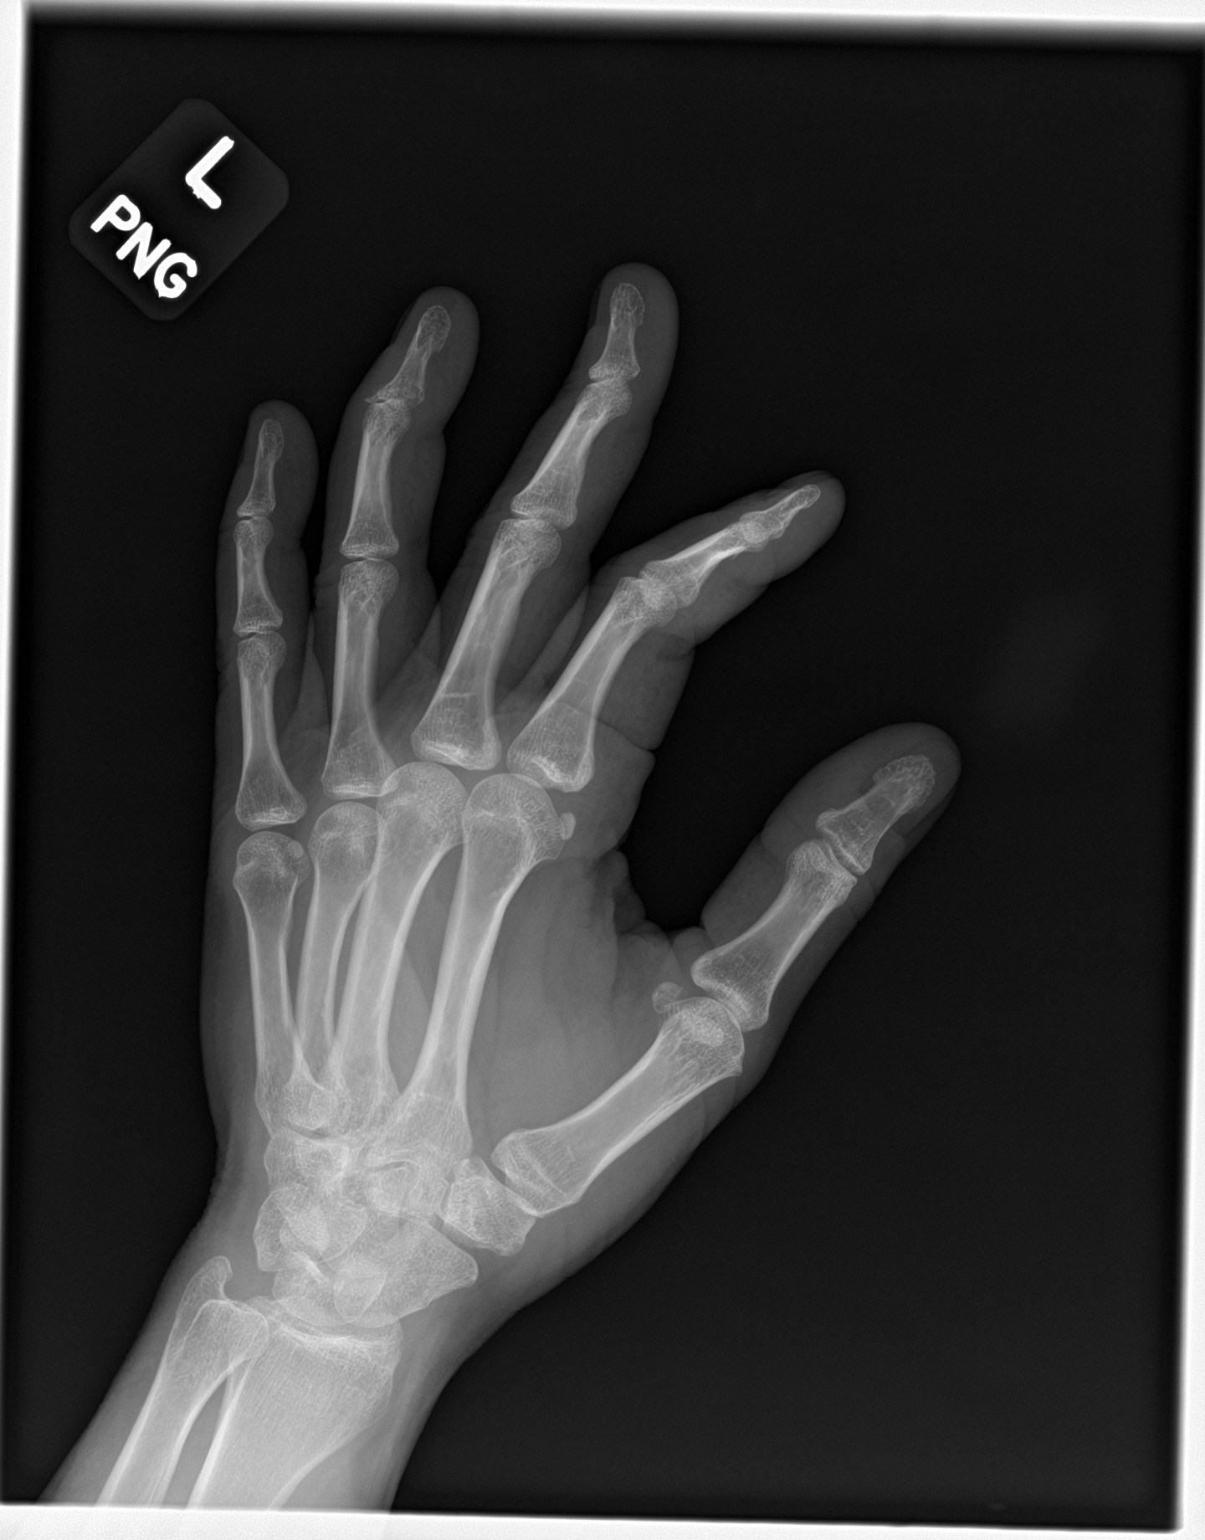

[hand lat]
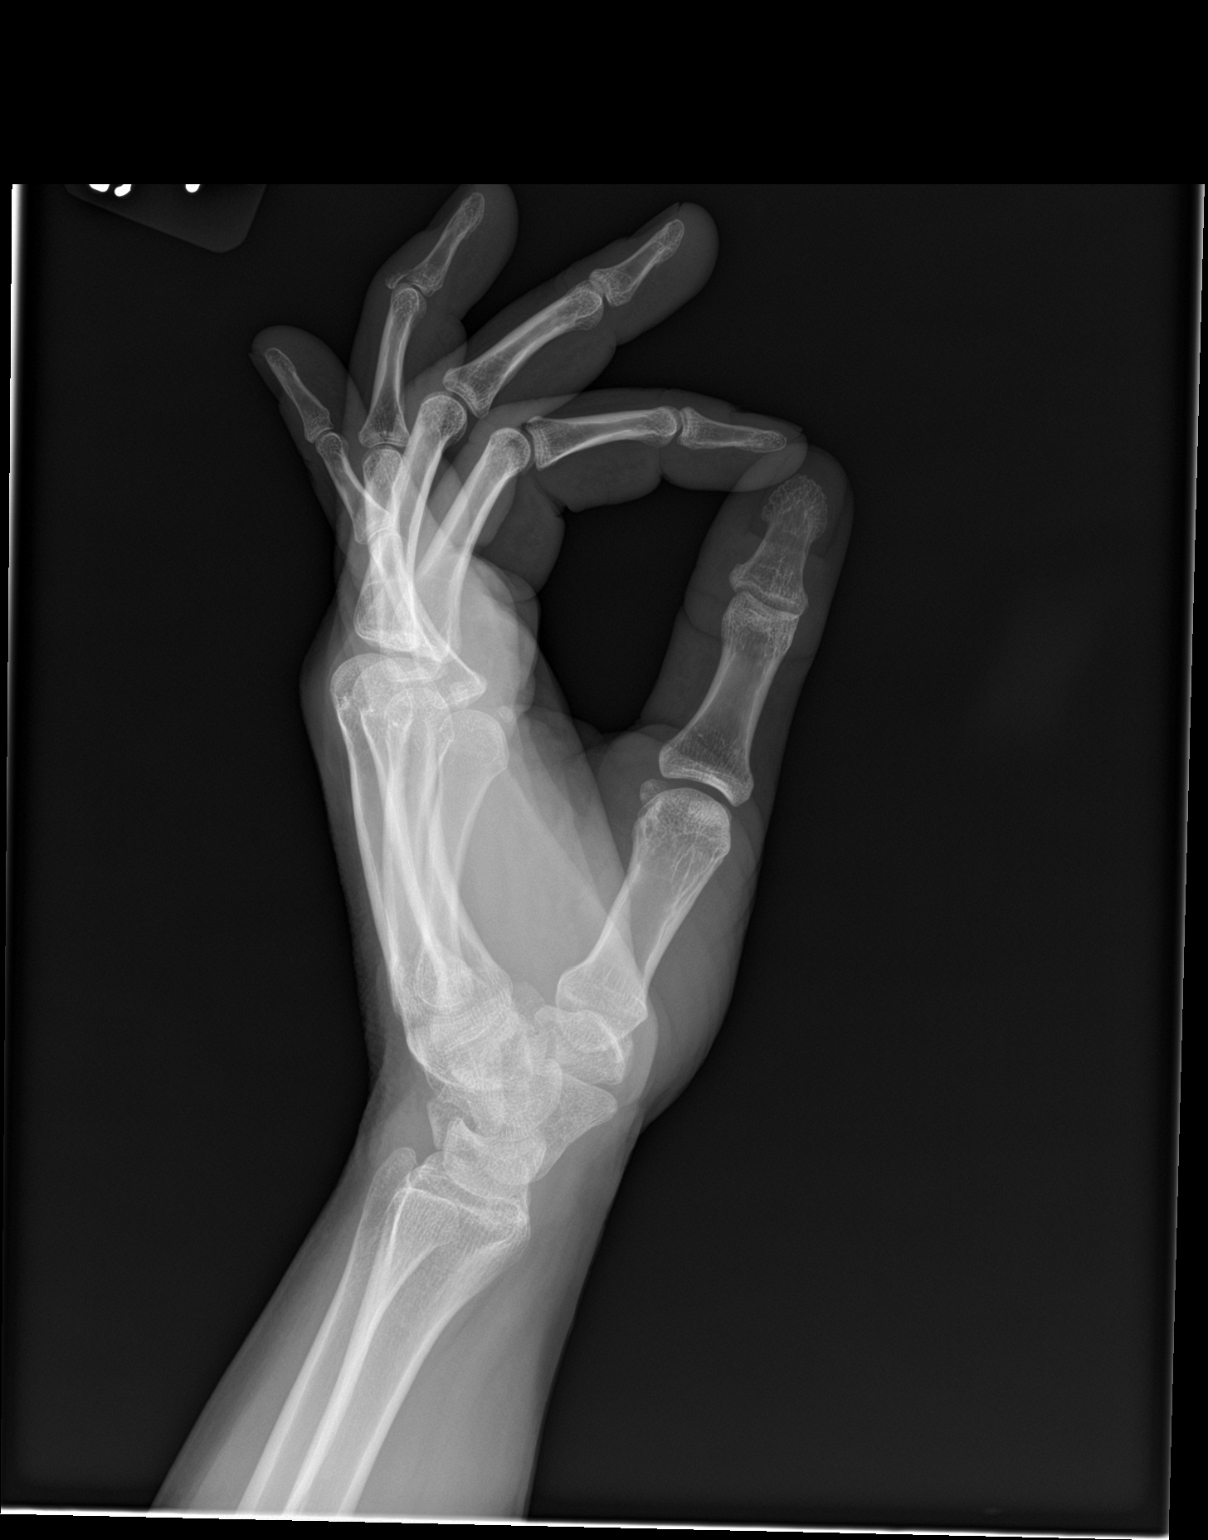

[3 of 3 positions shown; findings below may reference images not displayed]

FINDINGS: Avulsion fracture fragment at the fourth DIP joint, most likely
avulse from the base of the distal phalanx, positioned at the dorsal
margin of the DIP joint.
IMPRESSION: Avulsion fracture fragment at the dorsal margin of the fourth DIP
joint, most likely originating from the base of the distal phalanx.

## 2021-12-09 ENCOUNTER — Other Ambulatory Visit: Payer: Self-pay

## 2021-12-09 ENCOUNTER — Telehealth: Payer: Self-pay | Admitting: Physician Assistant

## 2021-12-09 MED ORDER — AMPHETAMINE-DEXTROAMPHETAMINE 20 MG PO TABS: 20.0000 mg | ORAL_TABLET | Freq: Two times a day (BID) | ORAL | 0 refills | Status: DC

## 2021-12-09 NOTE — Telephone Encounter (Signed)
Pt called at 3:30 pm requesting a refill on her adderall 20 mg. [facility] [address] Pt has an appt 4/4 ?

## 2021-12-09 NOTE — Telephone Encounter (Signed)
Pended.

## 2021-12-28 ENCOUNTER — Other Ambulatory Visit: Payer: Self-pay

## 2021-12-28 ENCOUNTER — Ambulatory Visit: Payer: BLUE CROSS/BLUE SHIELD | Admitting: Physician Assistant

## 2021-12-28 ENCOUNTER — Encounter: Payer: Self-pay | Admitting: Physician Assistant

## 2021-12-28 DIAGNOSIS — F901 Attention-deficit hyperactivity disorder, predominantly hyperactive type: Secondary | ICD-10-CM

## 2021-12-28 DIAGNOSIS — F4321 Adjustment disorder with depressed mood: Secondary | ICD-10-CM

## 2021-12-28 MED ORDER — AMPHETAMINE-DEXTROAMPHETAMINE 20 MG PO TABS: 20.0000 mg | ORAL_TABLET | Freq: Two times a day (BID) | ORAL | 0 refills | Status: DC

## 2021-12-28 NOTE — Progress Notes (Signed)
[facility] ? ?Patient ID: Ariel Tran,  ?MRN: [id number] ? ?PCP: Hoyt Koch, MD ? ?Date of Evaluation: 12/28/2021 ?time spent:20 minutes ? ?Chief Complaint:  ?Chief Complaint   ?ADHD ?  ? ? ?HISTORY/CURRENT STATUS: ?HPI For routine med check.  ? ?Meds are still working. States that attention is good without easy distractibility.  Able to focus on things and finish tasks to completion.  ? ?Has a granddaughter! Few months old now. Loving being a grandmother.  ? ?Patient denies loss of interest in usual activities and is able to enjoy things.  Denies decreased energy or motivation.  Appetite has not changed.  No extreme sadness, tearfulness, or feelings of hopelessness.  Denies suicidal or homicidal thoughts. ? ?Denies dizziness, syncope, seizures, numbness, tingling, tremor, tics, unsteady gait, slurred speech, confusion. Denies muscle or joint pain, stiffness, or dystonia. ? ?Individual Medical History/ Review of Systems: Changes? :No    ? ? ?Allergies: Levothyroxine sodium, Chicken allergy, Citrus, Contrast media [iodinated contrast media], Eggs or egg-derived products, and Tramadol ? ?Current Medications:  ?Current Outpatient Medications:  ?  amphetamine-dextroamphetamine (ADDERALL) 20 MG tablet, Take 1 tablet (20 mg total) by mouth 2 (two) times daily., Disp: 60 tablet, Rfl: 0 ?  amphetamine-dextroamphetamine (ADDERALL) 20 MG tablet, Take 1 tablet (20 mg total) by mouth 2 (two) times daily., Disp: 60 tablet, Rfl: 0 ?  EPINEPHrine 0.3 mg/0.3 mL IJ SOAJ injection, Inject 0.3 mg into the muscle as needed for anaphylaxis., Disp: 1 each, Rfl: 1 ?  fexofenadine (ALLEGRA) 180 MG tablet, Take 180 mg by mouth daily., Disp: , Rfl:  ?  Multiple Vitamins-Minerals (MULTIVITAMIN WITH MINERALS) tablet, Take 1 tablet by mouth daily., Disp: , Rfl:  ?  [START ON 03/08/2022] amphetamine-dextroamphetamine (ADDERALL) 20 MG tablet, Take 1 tablet (20 mg total) by mouth 2 (two) times daily., Disp: 60 tablet, Rfl:  0 ?  [START ON 02/06/2022] amphetamine-dextroamphetamine (ADDERALL) 20 MG tablet, Take 1 tablet (20 mg total) by mouth 2 (two) times daily., Disp: 60 tablet, Rfl: 0 ?  [START ON 01/07/2022] amphetamine-dextroamphetamine (ADDERALL) 20 MG tablet, Take 1 tablet (20 mg total) by mouth 2 (two) times daily., Disp: 60 tablet, Rfl: 0 ?  tamoxifen (NOLVADEX) 10 MG tablet, Take 1 tablet (10 mg total) by mouth daily. If tolerates can increase to 2 tablets daily (Patient not taking: No sig reported), Disp: 60 tablet, Rfl: 0 ?Medication Side Effects: none ? ?Family Medical/ Social History: Changes?  ?Her Mom died in 10-05-2023.  ?Has a 40-monthold granddaughter now! ?Has her own mail route now.  "I should be really happy but I am not."  Her supervisor manipulated her into signing something that would have affected her permanent status, she had to contact her union steward and file a grievance.  Now she does not trust the supervisor.  It has not been a good thing. ? ?MENTAL HEALTH EXAM: ? ?There were no vitals taken for this visit.There is no height or weight on file to calculate BMI.  ?General Appearance: Casual, Neat and Well Groomed  ?Eye Contact:  Good  ?Speech:  Clear and Coherent  ?Volume:  Normal  ?Mood:  Euthymic  ?Affect:  Appropriate  ?Thought Process:  Goal Directed and Descriptions of Associations: Circumstantial  ?Orientation:  Full (Time, Place, and Person)  ?Thought Content: Logical   ?Suicidal Thoughts:  No  ?Homicidal Thoughts:  No  ?Memory:  WNL  ?Judgement:  Good  ?Insight:  Good  ?Psychomotor Activity:  Normal  ?Concentration:  Concentration:  Good and Attention Span: Good  ?Recall:  Good  ?Fund of Knowledge: Good  ?Language: Good  ?Assets:  Desire for Improvement ?Financial Resources/Insurance ?Housing ?Transportation ?Vocational/Educational  ?ADL's:  Intact  ?Cognition: WNL  ?Prognosis:  Good  ? ? ?DIAGNOSES:  ?  ICD-10-CM   ?1. Attention deficit hyperactivity disorder (ADHD), predominantly hyperactive type  F90.1   ?   ?2. Feeling grief  F43.21   ?  ? ? ?Receiving Psychotherapy: No  ? ? ?RECOMMENDATIONS:  ?PDMP reviewed.  Last Adderall filled 12/09/2021. ?I provided 20 minutes of face to face time during this encounter, including time spent before and after the visit in records review, medical decision making, counseling pertinent to today's visit, and charting.  ?My condolences and the loss of her mom. ?Congratulations on her new granddaughter! ?Doing well so no med changes are needed.  ? ?Continue Adderall 20 mg, 1 p.o. every morning and 1/2-1 p.o. q. afternoon as needed.   ?Return in 6 months. ? ?Donnal Moat, PA-C  ?

## 2022-02-06 ENCOUNTER — Telehealth: Payer: Self-pay | Admitting: Physician Assistant

## 2022-02-06 ENCOUNTER — Other Ambulatory Visit: Payer: Self-pay | Admitting: Physician Assistant

## 2022-02-06 MED ORDER — AMPHETAMINE-DEXTROAMPHETAMINE 20 MG PO TABS: 20.0000 mg | ORAL_TABLET | Freq: Two times a day (BID) | ORAL | 0 refills | Status: DC

## 2022-02-06 NOTE — Telephone Encounter (Signed)
Pt called checking on the status of the adderall script. It looks like it failed to send. Please resend asdderall script. ?

## 2022-02-06 NOTE — Telephone Encounter (Signed)
Prescription was sent

## 2022-03-08 ENCOUNTER — Telehealth: Payer: Self-pay | Admitting: Physician Assistant

## 2022-03-08 ENCOUNTER — Other Ambulatory Visit: Payer: Self-pay

## 2022-03-08 MED ORDER — AMPHETAMINE-DEXTROAMPHETAMINE 20 MG PO TABS: 20.0000 mg | ORAL_TABLET | Freq: Two times a day (BID) | ORAL | 0 refills | Status: DC

## 2022-03-08 NOTE — Telephone Encounter (Signed)
Pended.

## 2022-03-08 NOTE — Telephone Encounter (Signed)
Pt called for a refill on her adderall. It looks like the the script failed to send. Please resend

## 2022-04-06 ENCOUNTER — Telehealth: Payer: Self-pay | Admitting: Physician Assistant

## 2022-04-06 ENCOUNTER — Other Ambulatory Visit: Payer: Self-pay

## 2022-04-06 ENCOUNTER — Telehealth: Payer: Self-pay

## 2022-04-06 MED ORDER — AMPHETAMINE-DEXTROAMPHETAMINE 20 MG PO TABS: 20.0000 mg | ORAL_TABLET | Freq: Two times a day (BID) | ORAL | 0 refills | Status: DC

## 2022-04-06 NOTE — Telephone Encounter (Signed)
Pended.

## 2022-04-06 NOTE — Telephone Encounter (Signed)
Prior Authorization submitted and approved for AMPHETAMINE-DEXTROAMPHETAMINE 20 MG effective 03/07/2022-04/06/2023 with FEP/BCBS

## 2022-04-06 NOTE — Telephone Encounter (Signed)
Patient called in for refill on Adderall '20mg'$ . Ph: [phone]. Appt 10/5 [facility] [place] [place]

## 2022-05-08 ENCOUNTER — Telehealth: Payer: Self-pay | Admitting: Physician Assistant

## 2022-05-08 ENCOUNTER — Other Ambulatory Visit: Payer: Self-pay

## 2022-05-08 MED ORDER — AMPHETAMINE-DEXTROAMPHETAMINE 20 MG PO TABS: 20.0000 mg | ORAL_TABLET | Freq: Two times a day (BID) | ORAL | 0 refills | Status: DC

## 2022-05-08 NOTE — Telephone Encounter (Signed)
Pended.

## 2022-05-08 NOTE — Telephone Encounter (Signed)
Pt lvm that she needed a refill on her adderall 20 mg. [facility] [address]

## 2022-06-07 ENCOUNTER — Telehealth: Payer: Self-pay | Admitting: Physician Assistant

## 2022-06-07 ENCOUNTER — Other Ambulatory Visit: Payer: Self-pay

## 2022-06-07 MED ORDER — AMPHETAMINE-DEXTROAMPHETAMINE 20 MG PO TABS: 20.0000 mg | ORAL_TABLET | Freq: Two times a day (BID) | ORAL | 0 refills | Status: DC

## 2022-06-07 NOTE — Telephone Encounter (Signed)
Pended.

## 2022-06-07 NOTE — Telephone Encounter (Signed)
Pt called and asked for a refill of her adderall 20 mg. [facility] [address] They do have it in stock

## 2022-06-29 ENCOUNTER — Telehealth (INDEPENDENT_AMBULATORY_CARE_PROVIDER_SITE_OTHER): Payer: Federal, State, Local not specified - PPO | Admitting: Physician Assistant

## 2022-06-29 ENCOUNTER — Encounter: Payer: Self-pay | Admitting: Physician Assistant

## 2022-06-29 DIAGNOSIS — F901 Attention-deficit hyperactivity disorder, predominantly hyperactive type: Secondary | ICD-10-CM

## 2022-06-29 MED ORDER — AMPHETAMINE-DEXTROAMPHETAMINE 20 MG PO TABS: 20.0000 mg | ORAL_TABLET | Freq: Two times a day (BID) | ORAL | 0 refills | Status: DC

## 2022-06-29 NOTE — Progress Notes (Signed)
[facility]  Patient ID: Ariel Tran,  MRN: [id number]  PCP: Hoyt Koch, MD  Date of Evaluation: 06/29/2022 time spent:20 minutes  Chief Complaint:  Chief Complaint   ADHD; Follow-up    Virtual Visit via Telehealth  I connected with patient by a video enabled telemedicine application with their informed consent, and verified patient privacy and that I am speaking with the correct person using two identifiers.  I am private, in my office and the patient is at work.  I discussed the limitations, risks, security and privacy concerns of performing an evaluation and management service by video and the availability of in person appointments. I also discussed with the patient that there may be a patient responsible charge related to this service. The patient expressed understanding and agreed to proceed.   I discussed the assessment and treatment plan with the patient. The patient was provided an opportunity to ask questions and all were answered. The patient agreed with the plan and demonstrated an understanding of the instructions.   The patient was advised to call back or seek an in-person evaluation if the symptoms worsen or if the condition fails to improve as anticipated.  I provided 20 minutes of non-face-to-face time during this encounter.  HISTORY/CURRENT STATUS: HPI For routine med check.   Meds are still working. States that attention is good without easy distractibility.  Able to focus on things and finish tasks to completion.   Patient is able to enjoy things.  Energy and motivation are good.  Work is going well.  He has her own mail route now.  No extreme sadness, tearfulness, or feelings of hopelessness.  Sleeps well most of the time. ADLs and personal hygiene are normal.   Appetite has not changed.  Weight is stable.  Denies suicidal or homicidal thoughts.  No complaints of anxiety.  Is excited that her son and daughter-in-law are pregnant and the  baby is due in March.  They have another baby that is 58 months old.  Denies dizziness, syncope, seizures, numbness, tingling, tremor, tics, unsteady gait, slurred speech, confusion. Denies muscle or joint pain, stiffness, or dystonia.  Individual Medical History/ Review of Systems: Changes? :No     Allergies: Levothyroxine sodium, Chicken allergy, Citrus, Contrast media [iodinated contrast media], Eggs or egg-derived products, and Tramadol  Current Medications:  Current Outpatient Medications:    [START ON 07/06/2022] amphetamine-dextroamphetamine (ADDERALL) 20 MG tablet, Take 1 tablet (20 mg total) by mouth 2 (two) times daily., Disp: 60 tablet, Rfl: 0   EPINEPHrine 0.3 mg/0.3 mL IJ SOAJ injection, Inject 0.3 mg into the muscle as needed for anaphylaxis., Disp: 1 each, Rfl: 1   fexofenadine (ALLEGRA) 180 MG tablet, Take 180 mg by mouth daily., Disp: , Rfl:    fluticasone (FLONASE) 50 MCG/ACT nasal spray, Place into both nostrils daily., Disp: , Rfl:    Multiple Vitamins-Minerals (MULTIVITAMIN WITH MINERALS) tablet, Take 1 tablet by mouth daily., Disp: , Rfl:    [START ON 09/03/2022] amphetamine-dextroamphetamine (ADDERALL) 20 MG tablet, Take 1 tablet (20 mg total) by mouth 2 (two) times daily., Disp: 60 tablet, Rfl: 0   [START ON 08/05/2022] amphetamine-dextroamphetamine (ADDERALL) 20 MG tablet, Take 1 tablet (20 mg total) by mouth 2 (two) times daily., Disp: 60 tablet, Rfl: 0   tamoxifen (NOLVADEX) 10 MG tablet, Take 1 tablet (10 mg total) by mouth daily. If tolerates can increase to 2 tablets daily (Patient not taking: Reported on 08/26/2019), Disp: 60 tablet, Rfl: 0 Medication Side  Effects: none  Family Medical/ Social History: Changes?  Another grandchild due in March 2024.  Excited about that.  MENTAL HEALTH EXAM:  There were no vitals taken for this visit.There is no height or weight on file to calculate BMI.  General Appearance: Casual, Neat and Well Groomed  Eye Contact:  Good   Speech:  Clear and Coherent  Volume:  Normal  Mood:  Euthymic  Affect:  Appropriate  Thought Process:  Goal Directed and Descriptions of Associations: Circumstantial  Orientation:  Full (Time, Place, and Person)  Thought Content: Logical   Suicidal Thoughts:  No  Homicidal Thoughts:  No  Memory:  WNL  Judgement:  Good  Insight:  Good  Psychomotor Activity:  Normal  Concentration:  Concentration: Good and Attention Span: Good  Recall:  Good  Fund of Knowledge: Good  Language: Good  Assets:  Doctor, general practice  ADL's:  Intact  Cognition: WNL  Prognosis:  Good   DIAGNOSES:    ICD-10-CM   1. Attention deficit hyperactivity disorder (ADHD), predominantly hyperactive type  F90.1       Receiving Psychotherapy: No   RECOMMENDATIONS:  PDMP reviewed.  Last Adderall filled 06/07/2022. I provided 20 minutes of non-face-to-face time during this encounter, including time spent before and after the visit in records review, medical decision making, counseling pertinent to today's visit, and charting.   Congratulations on another grandchild due next year! She is doing well as far as her medications go so no changes will be made.  Continue Adderall 20 mg, 1 p.o. every morning and 1/2-1 p.o. q. afternoon as needed.   Return in 6 months.  Donnal Moat, PA-C

## 2022-07-26 ENCOUNTER — Ambulatory Visit: Payer: Federal, State, Local not specified - PPO | Admitting: Physician Assistant

## 2022-10-03 ENCOUNTER — Other Ambulatory Visit: Payer: Self-pay

## 2022-10-03 ENCOUNTER — Telehealth: Payer: Self-pay | Admitting: Physician Assistant

## 2022-10-03 MED ORDER — AMPHETAMINE-DEXTROAMPHETAMINE 20 MG PO TABS: 20.0000 mg | ORAL_TABLET | Freq: Two times a day (BID) | ORAL | 0 refills | Status: DC

## 2022-10-03 NOTE — Telephone Encounter (Signed)
Pt requesting  generic adderall Rx to [facility]. Apt 4/9

## 2022-10-03 NOTE — Telephone Encounter (Signed)
Pended.

## 2023-01-02 ENCOUNTER — Ambulatory Visit: Payer: Federal, State, Local not specified - PPO | Admitting: Physician Assistant

## 2023-01-02 ENCOUNTER — Encounter: Payer: Self-pay | Admitting: Physician Assistant

## 2023-01-02 DIAGNOSIS — F901 Attention-deficit hyperactivity disorder, predominantly hyperactive type: Secondary | ICD-10-CM | POA: Diagnosis not present

## 2023-01-02 MED ORDER — AMPHETAMINE-DEXTROAMPHETAMINE 20 MG PO TABS: 20.0000 mg | ORAL_TABLET | Freq: Two times a day (BID) | ORAL | 0 refills | Status: DC

## 2023-01-02 NOTE — Progress Notes (Signed)
[facility]  Patient ID: Ariel CouncilmanLaurel D Capozzoli,  MRN: [id number]  PCP: Myrlene Brokerrawford, Elizabeth A, MD  Date of Evaluation: 01/02/2023 time spent:20 minutes  Chief Complaint:  Chief Complaint   ADHD; Follow-up    HISTORY/CURRENT STATUS: HPI For routine med check.   Adderall is still working well. States that attention is good without easy distractibility.  Able to focus on things and finish tasks to completion.   Patient is able to enjoy things.  Energy and motivation are good.  Work is going well except her boss lies and causes her a lot of trouble.  No extreme sadness, tearfulness, or feelings of hopelessness.  Sleeps well most of the time. ADLs and personal hygiene are normal.  Appetite has not changed.  Weight is stable.  Denies suicidal or homicidal thoughts.  Patient denies increased energy with decreased need for sleep, increased talkativeness, racing thoughts, impulsivity or risky behaviors, increased spending, increased libido, grandiosity, increased irritability or anger, paranoia, or hallucinations.  Denies dizziness, syncope, seizures, numbness, tingling, tremor, tics, unsteady gait, slurred speech, confusion. Denies muscle or joint pain, stiffness, or dystonia.  Individual Medical History/ Review of Systems: Changes? :No     Allergies: Levothyroxine sodium, Chicken allergy, Citrus, Contrast media [iodinated contrast media], Egg-derived products, and Tramadol  Current Medications:  Current Outpatient Medications:    fexofenadine (ALLEGRA) 180 MG tablet, Take 180 mg by mouth daily., Disp: , Rfl:    fluticasone (FLONASE) 50 MCG/ACT nasal spray, Place into both nostrils daily., Disp: , Rfl:    Multiple Vitamins-Minerals (MULTIVITAMIN WITH MINERALS) tablet, Take 1 tablet by mouth daily., Disp: , Rfl:    amphetamine-dextroamphetamine (ADDERALL) 20 MG tablet, Take 1 tablet (20 mg total) by mouth 2 (two) times daily., Disp: 60 tablet, Rfl: 0   [START ON 01/31/2023]  amphetamine-dextroamphetamine (ADDERALL) 20 MG tablet, Take 1 tablet (20 mg total) by mouth 2 (two) times daily., Disp: 60 tablet, Rfl: 0   [START ON 03/02/2023] amphetamine-dextroamphetamine (ADDERALL) 20 MG tablet, Take 1 tablet (20 mg total) by mouth 2 (two) times daily., Disp: 60 tablet, Rfl: 0   EPINEPHrine 0.3 mg/0.3 mL IJ SOAJ injection, Inject 0.3 mg into the muscle as needed for anaphylaxis. (Patient not taking: Reported on 01/02/2023), Disp: 1 each, Rfl: 1   tamoxifen (NOLVADEX) 10 MG tablet, Take 1 tablet (10 mg total) by mouth daily. If tolerates can increase to 2 tablets daily (Patient not taking: Reported on 08/26/2019), Disp: 60 tablet, Rfl: 0 Medication Side Effects: none  Family Medical/ Social History: Changes?  Grandson born last month.   MENTAL HEALTH EXAM:  There were no vitals taken for this visit.There is no height or weight on file to calculate BMI.  General Appearance: Casual, Neat and Well Groomed  Eye Contact:  Good  Speech:  Clear and Coherent  Volume:  Normal  Mood:  Euthymic  Affect:  Appropriate  Thought Process:  Goal Directed and Descriptions of Associations: Circumstantial  Orientation:  Full (Time, Place, and Person)  Thought Content: Logical   Suicidal Thoughts:  No  Homicidal Thoughts:  No  Memory:  WNL  Judgement:  Good  Insight:  Good  Psychomotor Activity:  Normal  Concentration:  Concentration: Good and Attention Span: Good  Recall:  Good  Fund of Knowledge: Good  Language: Good  Assets:  Desire for Improvement Financial Resources/Insurance Housing Transportation Vocational/Educational  ADL's:  Intact  Cognition: WNL  Prognosis:  Good   DIAGNOSES:    ICD-10-CM   1. Attention deficit hyperactivity  disorder (ADHD), predominantly hyperactive type  F90.1      Receiving Psychotherapy: No   RECOMMENDATIONS:  PDMP reviewed.  Last Adderall filled 11/30/2022. I provided 20 minutes of face to face time during this encounter, including time  spent before and after the visit in records review, medical decision making, counseling pertinent to today's visit, and charting.   She is doing well as far as her medications go so no changes will be made.  Continue Adderall 20 mg, 1 p.o. every morning and 1/2-1 p.o. q. afternoon as needed.   Return in 6 months.  Melony Overly, PA-C

## 2023-03-30 ENCOUNTER — Other Ambulatory Visit: Payer: Self-pay

## 2023-03-30 ENCOUNTER — Telehealth: Payer: Self-pay | Admitting: Physician Assistant

## 2023-03-30 MED ORDER — AMPHETAMINE-DEXTROAMPHETAMINE 20 MG PO TABS: 20.0000 mg | ORAL_TABLET | Freq: Two times a day (BID) | ORAL | 0 refills | Status: DC

## 2023-03-30 NOTE — Telephone Encounter (Signed)
Patient called in for refill on Adderall 20mg . Ph: [phone] Appt 10/8 Pharmacy [facility] [address] [place]

## 2023-03-30 NOTE — Telephone Encounter (Signed)
Pended.

## 2023-05-01 ENCOUNTER — Telehealth: Payer: Self-pay | Admitting: Physician Assistant

## 2023-05-01 ENCOUNTER — Other Ambulatory Visit: Payer: Self-pay

## 2023-05-01 NOTE — Telephone Encounter (Signed)
Pended.

## 2023-05-01 NOTE — Telephone Encounter (Signed)
PT called in at 4:30 with request to RF Adderall 20mg  to : [id number] - [place], [place] - [address]  [address] [place],

## 2023-05-02 ENCOUNTER — Telehealth: Payer: Self-pay | Admitting: Physician Assistant

## 2023-05-02 MED ORDER — AMPHETAMINE-DEXTROAMPHETAMINE 20 MG PO TABS: 20.0000 mg | ORAL_TABLET | Freq: Two times a day (BID) | ORAL | 0 refills | Status: DC

## 2023-05-02 NOTE — Telephone Encounter (Signed)
Pharmacy sent PA renewal request for Adderall 20mg   , See CMM

## 2023-05-02 NOTE — Telephone Encounter (Signed)
PA approved 04/02/2023 through 05/01/2024

## 2023-05-03 ENCOUNTER — Telehealth: Payer: Self-pay | Admitting: Physician Assistant

## 2023-05-07 NOTE — Telephone Encounter (Signed)
error 

## 2023-06-02 DIAGNOSIS — R42 Dizziness and giddiness: Secondary | ICD-10-CM | POA: Diagnosis not present

## 2023-06-02 DIAGNOSIS — R519 Headache, unspecified: Secondary | ICD-10-CM | POA: Diagnosis not present

## 2023-06-02 DIAGNOSIS — S0990XA Unspecified injury of head, initial encounter: Secondary | ICD-10-CM | POA: Diagnosis not present

## 2023-06-02 DIAGNOSIS — R5383 Other fatigue: Secondary | ICD-10-CM | POA: Diagnosis not present

## 2023-06-04 ENCOUNTER — Telehealth: Payer: Self-pay | Admitting: Physician Assistant

## 2023-06-04 ENCOUNTER — Other Ambulatory Visit: Payer: Self-pay

## 2023-06-04 ENCOUNTER — Ambulatory Visit: Payer: Federal, State, Local not specified - PPO | Admitting: Family Medicine

## 2023-06-04 VITALS — BP 160/98 | HR 84 | Temp 98.1°F | Ht 64.0 in | Wt 141.0 lb

## 2023-06-04 DIAGNOSIS — R519 Headache, unspecified: Secondary | ICD-10-CM

## 2023-06-04 DIAGNOSIS — J069 Acute upper respiratory infection, unspecified: Secondary | ICD-10-CM | POA: Diagnosis not present

## 2023-06-04 DIAGNOSIS — R030 Elevated blood-pressure reading, without diagnosis of hypertension: Secondary | ICD-10-CM | POA: Diagnosis not present

## 2023-06-04 MED ORDER — AMPHETAMINE-DEXTROAMPHETAMINE 20 MG PO TABS: 20.0000 mg | ORAL_TABLET | Freq: Two times a day (BID) | ORAL | 0 refills | Status: DC

## 2023-06-04 NOTE — Patient Instructions (Addendum)
Monitor blood pressure at home, keep a log, and bring it with you to your next appointment.   BP <140/90

## 2023-06-04 NOTE — Telephone Encounter (Signed)
Patient lvm for refill on Adderall 20mg . Ph: [phone] Appt 10/8 Pharmacy [facility] [address] [place]

## 2023-06-04 NOTE — Progress Notes (Signed)
Assessment & Plan:  1-2. Severe headache/Viral URI Resolved.  Note provided for patient to return to work tomorrow.  Headache felt to be a combination of hitting her head on the dashboard and having a viral illness.  3. Elevated BP without diagnosis of hypertension Education provided on the DASH diet.  Encourage patient to monitor her blood pressure, keep a log, and bring it with her to her next appointment.  Discussed goal blood pressure <140/90.  Stressed the importance of scheduling an appointment with her PCP as she has not been seen in a couple of years.   Follow up plan: Return for CPE with PCP (overdue).  Deliah Boston, MSN, APRN, FNP-C  Subjective:  HPI: Ariel Tran is a 57 y.o. female presenting on 06/04/2023 for Headache (1- hit head when a tire blew last Wednesday /2- thinks she also had a virus - fatigued, fever/chills and bad HA )  Patient is in need of a note to return to work tomorrow.  She called out of work on Saturday due to a severe headache.  This all started on Wednesday when she hit her head on the dashboard when her tire blew out.  She went to work the next day but had a headache all day that went from the base of her neck to the top of her head.  She rated her pain 7/10.  She was rotating Tylenol and ibuprofen, which was not helpful.  She called the nurse line for her insurance at which point she was advised to go to urgent care.  She was very tired and due to her severe headache just wanted to lay down and rest.  She went to urgent care on 06/02/2023 where she was advised to go to the emergency room.  At urgent care her blood pressure was 145/107 and she had a temperature of 100.2; this was with taking Tylenol and ibuprofen all day.  She did not go to the emergency room as it was packed.  Today she reports she is feeling 100% better.   ROS: Negative unless specifically indicated above in HPI.   Relevant past medical history reviewed and updated as indicated.    Allergies and medications reviewed and updated.   Current Outpatient Medications:    amphetamine-dextroamphetamine (ADDERALL) 20 MG tablet, Take 1 tablet (20 mg total) by mouth 2 (two) times daily., Disp: 60 tablet, Rfl: 0   fexofenadine (ALLEGRA) 180 MG tablet, Take 180 mg by mouth daily., Disp: , Rfl:    fluticasone (FLONASE) 50 MCG/ACT nasal spray, Place into both nostrils daily., Disp: , Rfl:    Multiple Vitamins-Minerals (MULTIVITAMIN WITH MINERALS) tablet, Take 1 tablet by mouth daily., Disp: , Rfl:    amphetamine-dextroamphetamine (ADDERALL) 20 MG tablet, Take 1 tablet (20 mg total) by mouth 2 (two) times daily. (Patient not taking: Reported on 06/04/2023), Disp: 60 tablet, Rfl: 0   amphetamine-dextroamphetamine (ADDERALL) 20 MG tablet, Take 1 tablet (20 mg total) by mouth 2 (two) times daily. (Patient not taking: Reported on 06/04/2023), Disp: 60 tablet, Rfl: 0   EPINEPHrine 0.3 mg/0.3 mL IJ SOAJ injection, Inject 0.3 mg into the muscle as needed for anaphylaxis. (Patient not taking: Reported on 01/02/2023), Disp: 1 each, Rfl: 1  Allergies  Allergen Reactions   Levothyroxine Sodium Itching and Rash   Chicken Allergy    Citrus    Contrast Media [Iodinated Contrast Media]    Egg-Derived Products    Tramadol Nausea Only    dizziness    Objective:  BP (!) 160/98   Pulse 84   Temp 98.1 F (36.7 C)   Ht 5\' 4"  (1.626 m)   Wt 141 lb (64 kg)   LMP 08/10/2019   BMI 24.20 kg/m    Physical Exam Vitals reviewed.  Constitutional:      General: She is not in acute distress.    Appearance: Normal appearance. She is not ill-appearing, toxic-appearing or diaphoretic.  HENT:     Head: Normocephalic and atraumatic.  Eyes:     General: No scleral icterus.       Right eye: No discharge.        Left eye: No discharge.     Conjunctiva/sclera: Conjunctivae normal.  Cardiovascular:     Rate and Rhythm: Normal rate and regular rhythm.     Heart sounds: Normal heart sounds. No murmur  heard.    No friction rub. No gallop.  Pulmonary:     Effort: Pulmonary effort is normal. No respiratory distress.     Breath sounds: Normal breath sounds. No stridor. No wheezing, rhonchi or rales.  Musculoskeletal:        General: Normal range of motion.     Cervical back: Normal range of motion.  Skin:    General: Skin is warm and dry.     Capillary Refill: Capillary refill takes less than 2 seconds.  Neurological:     General: No focal deficit present.     Mental Status: She is alert and oriented to person, place, and time. Mental status is at baseline.  Psychiatric:        Mood and Affect: Mood normal.        Behavior: Behavior normal.        Thought Content: Thought content normal.        Judgment: Judgment normal.

## 2023-06-04 NOTE — Telephone Encounter (Signed)
Pended.

## 2023-06-26 ENCOUNTER — Other Ambulatory Visit: Payer: Self-pay | Admitting: Internal Medicine

## 2023-06-26 ENCOUNTER — Ambulatory Visit (INDEPENDENT_AMBULATORY_CARE_PROVIDER_SITE_OTHER): Payer: Federal, State, Local not specified - PPO | Admitting: Internal Medicine

## 2023-06-26 ENCOUNTER — Encounter: Payer: Self-pay | Admitting: Internal Medicine

## 2023-06-26 VITALS — BP 150/72 | HR 70 | Temp 98.7°F | Ht 64.0 in | Wt 143.0 lb

## 2023-06-26 DIAGNOSIS — R739 Hyperglycemia, unspecified: Secondary | ICD-10-CM

## 2023-06-26 DIAGNOSIS — R030 Elevated blood-pressure reading, without diagnosis of hypertension: Secondary | ICD-10-CM

## 2023-06-26 DIAGNOSIS — E039 Hypothyroidism, unspecified: Secondary | ICD-10-CM

## 2023-06-26 DIAGNOSIS — Z0000 Encounter for general adult medical examination without abnormal findings: Secondary | ICD-10-CM

## 2023-06-26 DIAGNOSIS — F900 Attention-deficit hyperactivity disorder, predominantly inattentive type: Secondary | ICD-10-CM | POA: Diagnosis not present

## 2023-06-26 LAB — LIPID PANEL
Cholesterol: 281 mg/dL — ABNORMAL HIGH (ref 0–200)
HDL: 95.2 mg/dL (ref >39.00)
LDL Cholesterol: 159 mg/dL — ABNORMAL HIGH (ref 0–99)
NonHDL: 185.58
Total CHOL/HDL Ratio: 3
Triglycerides: 133 mg/dL (ref 0.0–149.0)
VLDL: 26.6 mg/dL (ref 0.0–40.0)

## 2023-06-26 LAB — COMPREHENSIVE METABOLIC PANEL
ALT: 21 U/L (ref 0–35)
AST: 22 U/L (ref 0–37)
Albumin: 4.9 g/dL (ref 3.5–5.2)
Alkaline Phosphatase: 114 U/L (ref 39–117)
BUN: 17 mg/dL (ref 6–23)
CO2: 23 meq/L (ref 19–32)
Calcium: 9.8 mg/dL (ref 8.4–10.5)
Chloride: 101 meq/L (ref 96–112)
Creatinine, Ser: 0.75 mg/dL (ref 0.40–1.20)
GFR: 88.07 mL/min (ref >60.00)
Glucose, Bld: 113 mg/dL — ABNORMAL HIGH (ref 70–99)
Potassium: 4.3 meq/L (ref 3.5–5.1)
Sodium: 135 meq/L (ref 135–145)
Total Bilirubin: 1.1 mg/dL (ref 0.2–1.2)
Total Protein: 7.8 g/dL (ref 6.0–8.3)

## 2023-06-26 LAB — CBC
HCT: 43.2 % (ref 36.0–46.0)
Hemoglobin: 14.2 g/dL (ref 12.0–15.0)
MCHC: 32.8 g/dL (ref 30.0–36.0)
MCV: 89.9 fL (ref 78.0–100.0)
Platelets: 229 10*3/uL (ref 150.0–400.0)
RBC: 4.8 Mil/uL (ref 3.87–5.11)
RDW: 13.6 % (ref 11.5–15.5)
WBC: 4.5 10*3/uL (ref 4.0–10.5)

## 2023-06-26 LAB — TSH: TSH: 1.04 u[IU]/mL (ref 0.35–5.50)

## 2023-06-26 LAB — T4, FREE: Free T4: 0.78 ng/dL (ref 0.60–1.60)

## 2023-06-26 LAB — VITAMIN D 25 HYDROXY (VIT D DEFICIENCY, FRACTURES): VITD: 32.43 ng/mL (ref 30.00–100.00)

## 2023-06-26 NOTE — Progress Notes (Signed)
   Subjective:   Patient ID: Ariel Tran, female    DOB: [birth year], 57 y.o.   MRN: [id number]  HPI The patient is here for follow up and elevated BP. Last visit 2022. Recent history reviewed from urgent care and another provider at this office. She is having some high BP lately and headache is resolved. Needs follow up chronic conditions as well.  Review of Systems  Constitutional: Negative.   HENT: Negative.    Eyes: Negative.   Respiratory:  Negative for cough, chest tightness and shortness of breath.   Cardiovascular:  Negative for chest pain, palpitations and leg swelling.  Gastrointestinal:  Negative for abdominal distention, abdominal pain, constipation, diarrhea, nausea and vomiting.  Musculoskeletal: Negative.   Skin: Negative.   Neurological:  Positive for headaches.  Psychiatric/Behavioral: Negative.      Objective:  Physical Exam Constitutional:      Appearance: She is well-developed.  HENT:     Head: Normocephalic and atraumatic.  Cardiovascular:     Rate and Rhythm: Normal rate and regular rhythm.  Pulmonary:     Effort: Pulmonary effort is normal. No respiratory distress.     Breath sounds: Normal breath sounds. No wheezing or rales.  Abdominal:     General: Bowel sounds are normal. There is no distension.     Palpations: Abdomen is soft.     Tenderness: There is no abdominal tenderness. There is no rebound.  Musculoskeletal:     Cervical back: Normal range of motion.  Skin:    General: Skin is warm and dry.  Neurological:     Mental Status: She is alert and oriented to person, place, and time.     Coordination: Coordination normal.     Vitals:   06/26/23 0858 06/26/23 0905  BP: (!) 160/84 (!) 158/80  Pulse: 70   Temp: 98.7 F (37.1 C)   TempSrc: Oral   SpO2: 97%   Weight: 143 lb (64.9 kg)   Height: 5\' 4"  (1.626 m)     Assessment & Plan:  Visit time 25 minutes in face to face communication with patient and coordination of care, additional 5  minutes spent in record review, coordination or care, ordering tests, communicating/referring to other healthcare professionals, documenting in medical records all on the same day of the visit for total time 30 minutes spent on the visit.

## 2023-06-26 NOTE — Assessment & Plan Note (Addendum)
She did have high BP readings during episode of covid-19 likely. She still has some high BP readings today and will monitor. Filled out forms for her to get a BP monitoring device from her medical insurance. She is taking adderall and this can raise BP. Needs CBC, CMP, lipid as none in some time to assess.

## 2023-06-26 NOTE — Patient Instructions (Addendum)
Check the blood pressure 1-2 times a week and let us know what this is running.  The goal is <140/90

## 2023-06-26 NOTE — Assessment & Plan Note (Signed)
Needs labs today for monitoring. Checking TSH and free T4 and not on meds.

## 2023-06-26 NOTE — Assessment & Plan Note (Signed)
With new high BP (last visit prior to illness 2022) and adderall could be contributing. Will need close monitoring.

## 2023-06-27 ENCOUNTER — Ambulatory Visit (INDEPENDENT_AMBULATORY_CARE_PROVIDER_SITE_OTHER): Payer: Federal, State, Local not specified - PPO

## 2023-06-27 DIAGNOSIS — R739 Hyperglycemia, unspecified: Secondary | ICD-10-CM | POA: Diagnosis not present

## 2023-06-27 LAB — HEMOGLOBIN A1C: Hgb A1c MFr Bld: 5.9 % (ref 4.6–6.5)

## 2023-06-27 NOTE — Addendum Note (Signed)
Addended by: Hillard Danker A on: 06/27/2023 10:56 AM   Modules accepted: Level of Service

## 2023-06-29 ENCOUNTER — Other Ambulatory Visit: Payer: Self-pay | Admitting: Internal Medicine

## 2023-06-29 DIAGNOSIS — Z1211 Encounter for screening for malignant neoplasm of colon: Secondary | ICD-10-CM

## 2023-06-29 DIAGNOSIS — Z1212 Encounter for screening for malignant neoplasm of rectum: Secondary | ICD-10-CM

## 2023-07-03 ENCOUNTER — Encounter: Payer: Self-pay | Admitting: Physician Assistant

## 2023-07-03 ENCOUNTER — Ambulatory Visit: Payer: Federal, State, Local not specified - PPO | Admitting: Physician Assistant

## 2023-07-03 VITALS — BP 190/96 | HR 75

## 2023-07-03 DIAGNOSIS — Z566 Other physical and mental strain related to work: Secondary | ICD-10-CM | POA: Diagnosis not present

## 2023-07-03 DIAGNOSIS — I10 Essential (primary) hypertension: Secondary | ICD-10-CM

## 2023-07-03 DIAGNOSIS — F901 Attention-deficit hyperactivity disorder, predominantly hyperactive type: Secondary | ICD-10-CM | POA: Diagnosis not present

## 2023-07-03 MED ORDER — CLONIDINE HCL 0.1 MG PO TABS: 0.1000 mg | ORAL_TABLET | Freq: Every evening | ORAL | 0 refills | Status: DC

## 2023-07-03 MED ORDER — ALPRAZOLAM 0.25 MG PO TABS
0.2500 mg | ORAL_TABLET | Freq: Two times a day (BID) | ORAL | 0 refills | Status: DC | PRN
Start: 2023-07-03 — End: 2024-03-20

## 2023-07-03 NOTE — Progress Notes (Signed)
[facility]  Patient ID: Ariel Tran,  MRN: [id number]  PCP: Myrlene Broker, MD  Date of Evaluation: 07/03/2023 time spent: 32 minutes  Chief Complaint:  Chief Complaint   ADHD; Follow-up    HISTORY/CURRENT STATUS: HPI For routine 6 month med check.   Ariel Tran states she is doing well as far as the ADHD goes.  She feels like that Adderall is still working well.  States that attention is good without easy distractibility.  "I don't know what I'd be like off it." She does not always take the afternoon dose, because she is afraid it will affect her sleep.  Normally she sleeps well.  This morning she woke up around 3 AM, was wide awake, not sure why.  She has been under a lot of stress, mostly related to work.  Not having panic attacks but does feel overwhelmed.  She has had 4-5 flat tires in the past 6 months.  One of those times she had a blowout which caused her to hit her head on something in the car, maybe the steering wheel although she is not sure.  She did not lose consciousness.  States she had a big lump on the right upper forehead.  She was really tired so she went home but woke up the next day with the severe headache.  She went to urgent care and was told to go to the ER.  She felt too sick to go so she went home.  States she ended up having COVID.  She is over that completely now.  Her blood pressure was elevated at the urgent care visit, and that sparked a visit to her PCP and a complete physical.  Ariel Tran states her blood pressure was high at the doctor's office, she stayed there a while and her blood pressure went down before she was allowed to leave.    Patient is able to enjoy things.  Energy and motivation are good.  No extreme sadness, tearfulness, or feelings of hopelessness.  Sleeps well most of the time. ADLs and personal hygiene are normal.   Appetite has not changed.  Weight is stable.  Denies suicidal or homicidal thoughts.  Review of Systems   Constitutional:  Positive for malaise/fatigue.  HENT: Negative.    Eyes: Negative.   Respiratory: Negative.    Cardiovascular: Negative.   Gastrointestinal: Negative.   Genitourinary: Negative.   Musculoskeletal: Negative.   Skin: Negative.   Neurological:  Negative for dizziness, tingling, tremors, sensory change, speech change, focal weakness, seizures, loss of consciousness, weakness and headaches.  Endo/Heme/Allergies: Negative.   Psychiatric/Behavioral:         See HPI   Individual Medical History/ Review of Systems: Changes? :Yes see HPI and records on chart, diagnosed with hyperlipidemia  Allergies: Levothyroxine sodium, Chicken allergy, Citrus, Contrast media [iodinated contrast media], Egg-derived products, Sesame seed (diagnostic), and Tramadol  Current Medications:  Current Outpatient Medications:    ALPRAZolam (XANAX) 0.25 MG tablet, Take 1-2 tablets (0.25-0.5 mg total) by mouth 2 (two) times daily as needed for anxiety., Disp: 30 tablet, Rfl: 0   cloNIDine (CATAPRES) 0.1 MG tablet, Take 1 tablet (0.1 mg total) by mouth at bedtime., Disp: 30 tablet, Rfl: 0   amphetamine-dextroamphetamine (ADDERALL) 20 MG tablet, Take 1 tablet (20 mg total) by mouth 2 (two) times daily., Disp: 60 tablet, Rfl: 0   amphetamine-dextroamphetamine (ADDERALL) 20 MG tablet, Take 1 tablet (20 mg total) by mouth 2 (two) times daily. (Patient not taking: Reported on  06/04/2023), Disp: 60 tablet, Rfl: 0   amphetamine-dextroamphetamine (ADDERALL) 20 MG tablet, Take 1 tablet (20 mg total) by mouth 2 (two) times daily. (Patient not taking: Reported on 06/04/2023), Disp: 60 tablet, Rfl: 0   EPINEPHrine 0.3 mg/0.3 mL IJ SOAJ injection, Inject 0.3 mg into the muscle as needed for anaphylaxis. (Patient not taking: Reported on 01/02/2023), Disp: 1 each, Rfl: 1   fexofenadine (ALLEGRA) 180 MG tablet, Take 180 mg by mouth daily., Disp: , Rfl:    fluticasone (FLONASE) 50 MCG/ACT nasal spray, Place into both nostrils  daily., Disp: , Rfl:    Multiple Vitamins-Minerals (MULTIVITAMIN WITH MINERALS) tablet, Take 1 tablet by mouth daily., Disp: , Rfl:  Medication Side Effects: none  Family Medical/ Social History: Changes? no  MENTAL HEALTH EXAM:  Blood pressure (!) 190/96, pulse 75, last menstrual period 08/10/2019.There is no height or weight on file to calculate BMI.  Initial reading 198/103  General Appearance: Casual, Neat and Well Groomed  Eye Contact:  Good  Speech:  Clear and Coherent and Normal Rate  Volume:  Normal  Mood:  Anxious  Affect:  Anxious  Thought Process:  Goal Directed and Descriptions of Associations: Circumstantial  Orientation:  Full (Time, Place, and Person)  Thought Content: Logical   Suicidal Thoughts:  No  Homicidal Thoughts:  No  Memory:  WNL  Judgement:  Good  Insight:  Good  Psychomotor Activity:  Normal  Concentration:  Concentration: Good and Attention Span: Good  Recall:  Good  Fund of Knowledge: Good  Language: Good  Assets:  Desire for Improvement Financial Resources/Insurance Housing Transportation Vocational/Educational  ADL's:  Intact  Cognition: WNL  Prognosis:  Good   DIAGNOSES:    ICD-10-CM   1. Attention deficit hyperactivity disorder (ADHD), predominantly hyperactive type  F90.1     2. Malignant hypertension  I10     3. Stress at work  Z56.6       Receiving Psychotherapy: No   RECOMMENDATIONS:  PDMP reviewed.  Last Adderall filled 06/04/2023. I provided 32 minutes of face to face time during this encounter, including time spent before and after the visit in records review, medical decision making, counseling pertinent to today's visit, and charting.   Patient needs to go to urgent care or the ER because of the extremely elevated blood pressure.  This was taken twice, on 2 different machines.  She refuses to go.  She is not having any strokelike symptoms. If she starts having a headache, slurred speech, visual problems, extremity weakness,  facial droop, or any other strokelike symptoms go to the ER immediately.  She should increase water intake and have no salt.  Ariel Tran was instructed to call her PCP tomorrow to discuss this and get further direction from her.  I have sent Dr. Hillard Danker, her PCP, an email with this information and I will send this note to her as well.   Until we know more and her blood pressure is better controlled, she will hold the Adderall.    I recommend adding clonidine, mostly for the hypertension and anxiety but it may also help with the ADHD.  Benefits, risk and side effects were discussed and she accepts.  We are not worried about that diagnosis at this time though.    I also recommend adding Xanax to help her relax so she can go to sleep tonight.  This is understandably upsetting so she will benefit from an anxiolytic. We discussed the short-term and long-term risks of benzodiazepines including  sedation, increased risk of falling, dizziness, tolerance, and addictive potential.  Patient understands and accepts.   I recommend she stay home from work tomorrow.  States she cannot do that.  I stressed the importance of her own health, stroke, MI, blindness, kidney problems can occur with uncontrolled hypertension.  I understand she never had problems with this until after her COVID infection several months ago.  At any rate, she needs to be proactive and follow Dr. Frutoso Chase advice.  Hold Adderall. Start Xanax 0.25 mg, 1-2 p.o. twice daily as needed anxiety. Start clonidine 0.1 mg, 1 p.o. nightly. Return tentatively in 6 weeks.  Ariel Overly, PA-C

## 2023-07-04 ENCOUNTER — Encounter: Payer: Self-pay | Admitting: Internal Medicine

## 2023-07-04 ENCOUNTER — Other Ambulatory Visit: Payer: Self-pay | Admitting: Internal Medicine

## 2023-07-04 MED ORDER — VALSARTAN 80 MG PO TABS: 80.0000 mg | ORAL_TABLET | Freq: Every day | ORAL | 3 refills | Status: DC

## 2023-07-05 ENCOUNTER — Telehealth: Payer: Self-pay | Admitting: Physician Assistant

## 2023-07-05 NOTE — Telephone Encounter (Signed)
Patient Ariel Tran stating she is on ADHD medication, and that her BP is elevated. She was prescribed Clonidine by Dr Okey Dupre as well as Rosey Bath she stated. It is helping her sleep, but she is tired and experiencing dizziness. She is requesting something that is not a stimulant.  The patient was seen in the office on Tuesday, with no follow up scheduled. Contact # [phone]

## 2023-07-06 ENCOUNTER — Encounter: Payer: Self-pay | Admitting: Physician Assistant

## 2023-07-06 ENCOUNTER — Other Ambulatory Visit: Payer: Self-pay

## 2023-07-06 DIAGNOSIS — F901 Attention-deficit hyperactivity disorder, predominantly hyperactive type: Secondary | ICD-10-CM

## 2023-07-06 MED ORDER — ATOMOXETINE HCL 40 MG PO CAPS: 40.0000 mg | ORAL_CAPSULE | Freq: Every day | ORAL | 1 refills | Status: DC

## 2023-07-06 NOTE — Telephone Encounter (Signed)
Rtc to patient and explained the change in medication from Adderall to Strattera. Discussed her B/P when she was in the office with Rosey Bath which was elevated, she reports at the time she didn't have a headache or felt like she had any symptoms. She reports rushing to get to the apt but other then that couldn't explain it. 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 She mentioned getting a BP cuff and I agreed she should keep record of it, I advised her to check twice a day; morning and evening. She verbalized understanding. Also explained how the Strattera would feel vs. Adderall.   Will pend as directed by Rosey Bath.

## 2023-07-06 NOTE — Telephone Encounter (Signed)
Raven, I'm not sure if you called the pt to clarify or not, but the message taken by Inetta Fermo isn't completely correct, to no fault of hers, the front staff don't know all the medical terms, how they take meds, etc. They do a good job of getting as much info as possible, but it's not always completely accurate.  In the future please call the pt and confirm the info, then send me a message.The original note from Inetta Fermo says 'she was prescribed Clonidine by Dr. Okey Dupre as well as me' but that's not correct. It just helps to get more information so I know what to do. If you have any questions at all, please ask me or Traci.  I appreciate it!  I have sent [facility] and her PCP a note saying I'm sending in [place]. Please call pt and give her the info: it's a non-stimulant, FDA approved for ADHD. Possible SE are fatigue, anxiety, dry mouth, constipation, nausea, increased sweating.  Most of these will improve after a few weeks, if they last that long.  Unfortunately, this does not work as quickly as Adderall.  It may be a month or so before she notices a difference, although I have had some patients say it started working within 2 weeks.  Have her call the office to set up an appointment in about 6 weeks.  Please pend the Rx for me: 40 mg, #30, 1 po q am, RF 1. Thanks.

## 2023-07-09 NOTE — Telephone Encounter (Signed)
Noted  

## 2023-07-28 ENCOUNTER — Other Ambulatory Visit: Payer: Self-pay | Admitting: Physician Assistant

## 2023-07-28 DIAGNOSIS — F901 Attention-deficit hyperactivity disorder, predominantly hyperactive type: Secondary | ICD-10-CM

## 2023-07-30 NOTE — Telephone Encounter (Signed)
Please schedule pt appt.  

## 2023-07-30 NOTE — Telephone Encounter (Signed)
Pt has an appt.

## 2023-08-16 ENCOUNTER — Ambulatory Visit: Payer: Federal, State, Local not specified - PPO | Admitting: Physician Assistant

## 2023-08-18 ENCOUNTER — Other Ambulatory Visit: Payer: Self-pay | Admitting: Physician Assistant

## 2023-08-18 DIAGNOSIS — F901 Attention-deficit hyperactivity disorder, predominantly hyperactive type: Secondary | ICD-10-CM

## 2023-08-27 ENCOUNTER — Other Ambulatory Visit: Payer: Self-pay | Admitting: Physician Assistant

## 2023-09-10 ENCOUNTER — Other Ambulatory Visit: Payer: Self-pay | Admitting: Physician Assistant

## 2023-09-10 DIAGNOSIS — F901 Attention-deficit hyperactivity disorder, predominantly hyperactive type: Secondary | ICD-10-CM

## 2023-09-10 NOTE — Telephone Encounter (Signed)
Please schedule pt an appt

## 2023-09-12 DIAGNOSIS — R509 Fever, unspecified: Secondary | ICD-10-CM | POA: Diagnosis not present

## 2023-09-12 DIAGNOSIS — U071 COVID-19: Secondary | ICD-10-CM | POA: Diagnosis not present

## 2023-09-14 NOTE — Telephone Encounter (Signed)
Pt has appt 1/23.

## 2023-10-15 ENCOUNTER — Other Ambulatory Visit: Payer: Self-pay | Admitting: Physician Assistant

## 2023-10-15 DIAGNOSIS — F901 Attention-deficit hyperactivity disorder, predominantly hyperactive type: Secondary | ICD-10-CM

## 2023-10-18 ENCOUNTER — Encounter: Payer: Self-pay | Admitting: Physician Assistant

## 2023-10-18 ENCOUNTER — Ambulatory Visit: Payer: BC Managed Care – PPO | Admitting: Physician Assistant

## 2023-10-18 VITALS — BP 170/87 | HR 72

## 2023-10-18 DIAGNOSIS — F901 Attention-deficit hyperactivity disorder, predominantly hyperactive type: Secondary | ICD-10-CM | POA: Diagnosis not present

## 2023-10-18 DIAGNOSIS — R030 Elevated blood-pressure reading, without diagnosis of hypertension: Secondary | ICD-10-CM

## 2023-10-18 MED ORDER — ATOMOXETINE HCL 60 MG PO CAPS: 60.0000 mg | ORAL_CAPSULE | Freq: Every day | ORAL | 1 refills | Status: DC

## 2023-10-18 NOTE — Progress Notes (Signed)
[facility]  Patient ID: Ariel Tran,  MRN: [id number]  PCP: Myrlene Broker, MD  Date of Evaluation: 10/18/2023 time spent:20 minutes  Chief Complaint:  Chief Complaint   Follow-up    HISTORY/CURRENT STATUS: HPI For routine med check.   PCP started her on Valsartan since LOV.  BPs run around 132/80 at home. Since going off the stimulant she's had a lot of trouble w/ focus, energy and motivation. She's able to function at work but it's very hard. Strattera isn't helping much.   Patient is able to enjoy things.  No extreme sadness, tearfulness, or feelings of hopelessness.  Sleeps well most of the time. ADLs and personal hygiene are normal.   No change in memory. Appetite has not changed.  Weight is stable.  Anxious at times b/c she can't get things done like she did.  Denies suicidal or homicidal thoughts.  Patient denies increased energy with decreased need for sleep, increased talkativeness, racing thoughts, impulsivity or risky behaviors, increased spending, increased libido, grandiosity, increased irritability or anger, paranoia, or hallucinations.  Denies dizziness, syncope, seizures, numbness, tingling, tremor, tics, unsteady gait, slurred speech, confusion. Denies muscle or joint pain, stiffness, or dystonia.  Individual Medical History/ Review of Systems: Changes? :Yes  see HPI  Past medications for mental health diagnoses include: Adderall, ?Ritalin, Strattera, Clonidine for a few days, it caused too much fatigue.  Allergies: Levothyroxine sodium, Chicken allergy, Citrus, Contrast media [iodinated contrast media], Egg-derived products, Sesame seed (diagnostic), and Tramadol  Current Medications:  Current Outpatient Medications:    ALPRAZolam (XANAX) 0.25 MG tablet, Take 1-2 tablets (0.25-0.5 mg total) by mouth 2 (two) times daily as needed for anxiety., Disp: 30 tablet, Rfl: 0   atomoxetine (STRATTERA) 60 MG capsule, Take 1 capsule (60 mg total) by  mouth daily., Disp: 30 capsule, Rfl: 1   fexofenadine (ALLEGRA) 180 MG tablet, Take 180 mg by mouth daily., Disp: , Rfl:    fluticasone (FLONASE) 50 MCG/ACT nasal spray, Place into both nostrils daily., Disp: , Rfl:    Multiple Vitamins-Minerals (MULTIVITAMIN WITH MINERALS) tablet, Take 1 tablet by mouth daily., Disp: , Rfl:    valsartan (DIOVAN) 80 MG tablet, Take 1 tablet (80 mg total) by mouth daily., Disp: 90 tablet, Rfl: 3   EPINEPHrine 0.3 mg/0.3 mL IJ SOAJ injection, Inject 0.3 mg into the muscle as needed for anaphylaxis. (Patient not taking: Reported on 10/18/2023), Disp: 1 each, Rfl: 1 Medication Side Effects: none  Family Medical/ Social History: Changes? no  MENTAL HEALTH EXAM:  Blood pressure (!) 170/87, pulse 72, last menstrual period 08/10/2019.There is no height or weight on file to calculate BMI.     General Appearance: Casual, Neat and Well Groomed  Eye Contact:  Good  Speech:  Clear and Coherent and Normal Rate  Volume:  Normal  Mood:  Anxious  Affect:  Congruent  Thought Process:  Goal Directed and Descriptions of Associations: Circumstantial  Orientation:  Full (Time, Place, and Person)  Thought Content: Logical   Suicidal Thoughts:  No  Homicidal Thoughts:  No  Memory:  WNL  Judgement:  Good  Insight:  Good  Psychomotor Activity:  Normal  Concentration:  Concentration: Fair and Attention Span: Fair  Recall:  Good  Fund of Knowledge: Good  Language: Good  Assets:  Desire for Improvement Financial Resources/Insurance Housing Transportation Vocational/Educational  ADL's:  Intact  Cognition: WNL  Prognosis:  Good   DIAGNOSES:    ICD-10-CM   1. Attention deficit hyperactivity disorder (  ADHD), predominantly hyperactive type  F90.1     2. Elevated blood pressure reading  R03.0       Receiving Psychotherapy: No   RECOMMENDATIONS:  PDMP reviewed.  Xanax filled 07/03/2023.  Last Adderall filled 06/04/2023. I provided 20 minutes of face to face time during  this encounter, including time spent before and after the visit in records review, medical decision making, counseling pertinent to today's visit, and charting.   Disc BP. Will continue to hold stimulant until I'm sure BP is controlled and I get the ok from PCP to use Adderall.   Cont Xanax 0.25 mg, 1-2 p.o. twice daily as needed anxiety. Increase Strattera to 60 mg, 1 every day.  Return in 6 weeks.  Melony Overly, PA-C

## 2023-11-09 ENCOUNTER — Other Ambulatory Visit: Payer: Self-pay | Admitting: Physician Assistant

## 2023-12-06 ENCOUNTER — Ambulatory Visit: Payer: BC Managed Care – PPO | Admitting: Physician Assistant

## 2023-12-06 ENCOUNTER — Telehealth: Payer: Self-pay | Admitting: Physician Assistant

## 2023-12-06 MED ORDER — ATOMOXETINE HCL 60 MG PO CAPS: 60.0000 mg | ORAL_CAPSULE | Freq: Every day | ORAL | 2 refills | Status: DC

## 2023-12-06 NOTE — Telephone Encounter (Signed)
 RF for Strattera sent

## 2023-12-06 NOTE — Telephone Encounter (Signed)
 Pt called at 10:26a requesting refill of generic Strattera to   [facility] [address] [place], [place] - [address] [place], [place] [address] Phone: [phone]  Fax: [phone]    Next appt 5/1

## 2024-01-02 ENCOUNTER — Other Ambulatory Visit: Payer: Self-pay | Admitting: Physician Assistant

## 2024-01-24 ENCOUNTER — Encounter: Payer: Self-pay | Admitting: Physician Assistant

## 2024-01-24 ENCOUNTER — Ambulatory Visit: Admitting: Physician Assistant

## 2024-01-24 VITALS — BP 150/80 | HR 86

## 2024-01-24 DIAGNOSIS — Z566 Other physical and mental strain related to work: Secondary | ICD-10-CM | POA: Diagnosis not present

## 2024-01-24 DIAGNOSIS — F901 Attention-deficit hyperactivity disorder, predominantly hyperactive type: Secondary | ICD-10-CM

## 2024-01-24 DIAGNOSIS — I10 Essential (primary) hypertension: Secondary | ICD-10-CM | POA: Diagnosis not present

## 2024-01-24 MED ORDER — AMPHETAMINE-DEXTROAMPHETAMINE 10 MG PO TABS: 5.0000 mg | ORAL_TABLET | Freq: Every day | ORAL | 0 refills | Status: DC

## 2024-01-24 NOTE — Progress Notes (Signed)
 [facility]  Patient ID: Ariel Tran,  MRN: [id number]  PCP: Adelia Homestead, MD  Date of Evaluation: 01/24/2024 time spent:20 minutes  Chief Complaint:  Chief Complaint   ADHD; Follow-up    HISTORY/CURRENT STATUS: HPI For routine med check.   BPs are usually 130/70s or less at home.  Work is still very stressful which she knows is a big part of it. She takes the valsartan  daily which has helped her.  States her blood pressure is usually elevated at any doctor's office or dentist.  Patient is able to enjoy things.  Energy and motivation are good.  No extreme sadness, tearfulness, or feelings of hopelessness.  Sleeps well most of the time. ADLs and personal hygiene are normal.   Appetite has not changed.  Weight is stable.   Denies suicidal or homicidal thoughts.  Since we stopped the Adderall last year she has had a hard time focusing and getting things done in a timely manner.  The Strattera  helps a little but is not as effective as the Adderall and she has low energy since stopping the Adderall.  Patient denies increased energy with decreased need for sleep, increased talkativeness, racing thoughts, impulsivity or risky behaviors, increased spending, increased libido, grandiosity, increased irritability or anger, paranoia, or hallucinations.  Denies dizziness, syncope, seizures, numbness, tingling, tremor, tics, unsteady gait, slurred speech, confusion. Denies muscle or joint pain, stiffness, or dystonia.  Individual Medical History/ Review of Systems: Changes? :No     Past medications for mental health diagnoses include: Adderall, ?Ritalin, Strattera , Clonidine  for a few days, it caused too much fatigue.  Allergies: Levothyroxine  sodium, Chicken allergy, Citrus, Contrast media [iodinated contrast media], Egg-derived products, Sesame seed (diagnostic), and Tramadol  Current Medications:  Current Outpatient Medications:    amphetamine -dextroamphetamine   (ADDERALL) 10 MG tablet, Take 0.5-1 tablets (5-10 mg total) by mouth daily with breakfast. prn, Disp: 30 tablet, Rfl: 0   atomoxetine  (STRATTERA ) 60 MG capsule, Take 1 capsule (60 mg total) by mouth daily., Disp: 30 capsule, Rfl: 2   fexofenadine (ALLEGRA) 180 MG tablet, Take 180 mg by mouth daily., Disp: , Rfl:    fluticasone (FLONASE) 50 MCG/ACT nasal spray, Place into both nostrils daily., Disp: , Rfl:    Multiple Vitamins-Minerals (MULTIVITAMIN WITH MINERALS) tablet, Take 1 tablet by mouth daily., Disp: , Rfl:    valsartan  (DIOVAN ) 80 MG tablet, Take 1 tablet (80 mg total) by mouth daily., Disp: 90 tablet, Rfl: 3   ALPRAZolam  (XANAX ) 0.25 MG tablet, Take 1-2 tablets (0.25-0.5 mg total) by mouth 2 (two) times daily as needed for anxiety. (Patient not taking: Reported on 01/24/2024), Disp: 30 tablet, Rfl: 0   EPINEPHrine  0.3 mg/0.3 mL IJ SOAJ injection, Inject 0.3 mg into the muscle as needed for anaphylaxis. (Patient not taking: Reported on 01/02/2023), Disp: 1 each, Rfl: 1 Medication Side Effects: none  Family Medical/ Social History: Changes? no  MENTAL HEALTH EXAM:  Blood pressure (!) 150/80, pulse 86, last menstrual period 08/10/2019.There is no height or weight on file to calculate BMI.     General Appearance: Casual, Neat and Well Groomed  Eye Contact:  Good  Speech:  Clear and Coherent and Normal Rate  Volume:  Normal  Mood:  Euthymic  Affect:  Congruent  Thought Process:  Goal Directed and Descriptions of Associations: Circumstantial  Orientation:  Full (Time, Place, and Person)  Thought Content: Logical   Suicidal Thoughts:  No  Homicidal Thoughts:  No  Memory:  WNL  Judgement:  Good  Insight:  Good  Psychomotor Activity:  Normal  Concentration:  Concentration: Fair and Attention Span: Fair  Recall:  Good  Fund of Knowledge: Good  Language: Good  Assets:  Desire for Improvement Financial Resources/Insurance Housing Transportation Vocational/Educational  ADL's:  Intact   Cognition: WNL  Prognosis:  Good   DIAGNOSES:    ICD-10-CM   1. Attention deficit hyperactivity disorder (ADHD), predominantly hyperactive type  F90.1     2. Stress at work  Z56.6     3. White coat syndrome with diagnosis of hypertension  I10       Receiving Psychotherapy: No   RECOMMENDATIONS:  PDMP reviewed.  Xanax  filled 07/03/2023.  Last Adderall filled 06/04/2023. I provided 20 minutes of face to face time during this encounter, including time spent before and after the visit in records review, medical decision making, counseling pertinent to today's visit, and charting.   Her blood pressures are controlled when she is at home.  It is still elevated here today but not nearly as high as last year when she went off Adderall.  Since she is having normal blood pressures at home I am comfortable prescribing a very low dose of Adderall for as needed use.  She should check her blood pressure every day, if it is running 140/90 consistently then she should stop it.  She understands.  Restart Adderall 10 mg, 1/2-1 daily a.m. as needed.   Continue Strattera  60 mg, 1 every day.  Return in 6-8 wks.    Marvia Slocumb, PA-C

## 2024-01-24 NOTE — Patient Instructions (Addendum)
 We're adding a low dose of Adderall for ADHD. Check your blood pressures daily for at least 2 weeks, and if it is consistently 140/90 or above, stop the Adderall. If you don't need the Adderall (like weekend days for example) you don't have to take it.  Continue Strattera .  Continue the BP medicine.

## 2024-02-26 ENCOUNTER — Other Ambulatory Visit: Payer: Self-pay

## 2024-02-26 ENCOUNTER — Telehealth: Payer: Self-pay | Admitting: Physician Assistant

## 2024-02-26 NOTE — Telephone Encounter (Signed)
 [facility]

## 2024-02-26 NOTE — Telephone Encounter (Signed)
 Patient lvm requesting a refill on the Adderall. Fill at the [facility] [address], [place] - [address] [place], [place] [address] Phone: [phone]  Fax: [phone]   Appointment scheduled for 03/20/24

## 2024-02-27 MED ORDER — AMPHETAMINE-DEXTROAMPHETAMINE 10 MG PO TABS: 5.0000 mg | ORAL_TABLET | Freq: Every day | ORAL | 0 refills | Status: DC

## 2024-02-29 ENCOUNTER — Other Ambulatory Visit: Payer: Self-pay | Admitting: Physician Assistant

## 2024-03-20 ENCOUNTER — Encounter: Payer: Self-pay | Admitting: Physician Assistant

## 2024-03-20 ENCOUNTER — Ambulatory Visit: Admitting: Physician Assistant

## 2024-03-20 VITALS — BP 166/94 | HR 88

## 2024-03-20 DIAGNOSIS — I10 Essential (primary) hypertension: Secondary | ICD-10-CM | POA: Diagnosis not present

## 2024-03-20 DIAGNOSIS — F901 Attention-deficit hyperactivity disorder, predominantly hyperactive type: Secondary | ICD-10-CM | POA: Diagnosis not present

## 2024-03-20 MED ORDER — AMPHETAMINE-DEXTROAMPHETAMINE 10 MG PO TABS
10.0000 mg | ORAL_TABLET | Freq: Every day | ORAL | 0 refills | Status: DC
Start: 2024-04-26 — End: 2024-03-27

## 2024-03-20 MED ORDER — AMPHETAMINE-DEXTROAMPHETAMINE 10 MG PO TABS: 10.0000 mg | ORAL_TABLET | Freq: Every day | ORAL | 0 refills | Status: DC

## 2024-03-20 MED ORDER — AMPHETAMINE-DEXTROAMPHETAMINE 10 MG PO TABS
10.0000 mg | ORAL_TABLET | Freq: Every day | ORAL | 0 refills | Status: DC
Start: 2024-04-27 — End: 2024-06-24

## 2024-03-20 MED ORDER — ATOMOXETINE HCL 60 MG PO CAPS: 60.0000 mg | ORAL_CAPSULE | Freq: Every day | ORAL | 5 refills | Status: DC

## 2024-03-20 NOTE — Progress Notes (Signed)
 [facility]  Patient ID: MIAMI LATULIPPE,  MRN: [id number]  PCP: Rollene Almarie LABOR, MD  Date of Evaluation: 03/20/2024 time spent:20 minutes  Chief Complaint:  Chief Complaint   Anxiety; ADHD; Follow-up     HISTORY/CURRENT STATUS: HPI For routine med check.   BPs are good at home.  Always runs around 120/80. Patient is able to enjoy things.  Energy and motivation are good.  Work is ok.  No extreme sadness, tearfulness, or feelings of hopelessness.  Sleeps well most of the time. ADLs and personal hygiene are normal.  Appetite has not changed.  Weight is stable.  No anxiety, delirium, psychosis, mania,  SI/HI.   States that attention is good without easy distractibility.  Able to focus on things and finish tasks to completion.   Denies dizziness, syncope, seizures, numbness, tingling, tremor, tics, unsteady gait, slurred speech, confusion. Denies muscle or joint pain, stiffness, or dystonia.  Individual Medical History/ Review of Systems: Changes? :No     Past medications for mental health diagnoses include: Adderall, ?Ritalin, Strattera , Clonidine  for a few days, it caused too much fatigue.  Allergies: Levothyroxine  sodium, Chicken allergy, Citrus, Contrast media [iodinated contrast media], Egg-derived products, Sesame seed (diagnostic), and Tramadol  Current Medications:  Current Outpatient Medications:    [START ON 04/26/2024] amphetamine -dextroamphetamine  (ADDERALL) 10 MG tablet, Take 1 tablet (10 mg total) by mouth daily with breakfast., Disp: 30 tablet, Rfl: 0   [START ON 04/27/2024] amphetamine -dextroamphetamine  (ADDERALL) 10 MG tablet, Take 1 tablet (10 mg total) by mouth daily with breakfast., Disp: 30 tablet, Rfl: 0   fexofenadine (ALLEGRA) 180 MG tablet, Take 180 mg by mouth daily., Disp: , Rfl:    fluticasone (FLONASE) 50 MCG/ACT nasal spray, Place into both nostrils daily., Disp: , Rfl:    Multiple Vitamins-Minerals (MULTIVITAMIN WITH MINERALS) tablet, Take  1 tablet by mouth daily., Disp: , Rfl:    [START ON 05/26/2024] amphetamine -dextroamphetamine  (ADDERALL) 10 MG tablet, Take 1 tablet (10 mg total) by mouth daily with breakfast., Disp: 30 tablet, Rfl: 0   atomoxetine  (STRATTERA ) 60 MG capsule, Take 1 capsule (60 mg total) by mouth daily., Disp: 30 capsule, Rfl: 5   EPINEPHrine  0.3 mg/0.3 mL IJ SOAJ injection, Inject 0.3 mg into the muscle as needed for anaphylaxis. (Patient not taking: Reported on 01/02/2023), Disp: 1 each, Rfl: 1   valsartan  (DIOVAN ) 80 MG tablet, Take 1 tablet (80 mg total) by mouth daily., Disp: 90 tablet, Rfl: 3 Medication Side Effects: none  Family Medical/ Social History: Changes? no  MENTAL HEALTH EXAM:  Blood pressure (!) 166/94, pulse 88, last menstrual period 08/10/2019.There is no height or weight on file to calculate BMI.     General Appearance: Casual, Neat and Well Groomed  Eye Contact:  Good  Speech:  Clear and Coherent and Normal Rate  Volume:  Normal  Mood:  Euthymic  Affect:  Congruent  Thought Process:  Goal Directed and Descriptions of Associations: Circumstantial  Orientation:  Full (Time, Place, and Person)  Thought Content: Logical   Suicidal Thoughts:  No  Homicidal Thoughts:  No  Memory:  WNL  Judgement:  Good  Insight:  Good  Psychomotor Activity:  Normal  Concentration:  Concentration: Good and Attention Span: Good  Recall:  Good  Fund of Knowledge: Good  Language: Good  Assets:  Desire for Improvement Financial Resources/Insurance Housing Transportation Vocational/Educational  ADL's:  Intact  Cognition: WNL  Prognosis:  Good   DIAGNOSES:    ICD-10-CM   1. Attention  deficit hyperactivity disorder (ADHD), predominantly hyperactive type  F90.1     2. White coat syndrome with diagnosis of hypertension  I10       Receiving Psychotherapy: No   RECOMMENDATIONS:  PDMP reviewed.  Xanax  filled 07/03/2023.  Last Adderall filled 02/27/2024. I provided 20 minutes of face to face time during  this encounter, including time spent before and after the visit in records review, medical decision making, counseling pertinent to today's visit, and charting.   She's doing well so no changes are needed.   Continue Adderall 10 mg, 1 daily a.m. Continue Strattera  60 mg, 1 every day.  Return in 6 months.   Verneita Cooks, PA-C

## 2024-03-25 ENCOUNTER — Telehealth: Payer: Self-pay | Admitting: Physician Assistant

## 2024-03-25 ENCOUNTER — Other Ambulatory Visit: Payer: Self-pay

## 2024-03-25 NOTE — Telephone Encounter (Signed)
 Pt called in stating Ariel Tran put in 2 scripts of Adderall for August and 1 needs to be for July. To be sent to   [id number] - [place], [place] - [address]

## 2024-03-25 NOTE — Telephone Encounter (Signed)
 LF 6/4, due 7/3. The 3 scripts sent at last appt have incorrect start dates.

## 2024-03-27 ENCOUNTER — Other Ambulatory Visit: Payer: Self-pay

## 2024-03-27 ENCOUNTER — Other Ambulatory Visit: Payer: Self-pay | Admitting: Physician Assistant

## 2024-03-27 MED ORDER — AMPHETAMINE-DEXTROAMPHETAMINE 10 MG PO TABS: 10.0000 mg | ORAL_TABLET | Freq: Every day | ORAL | 0 refills | Status: DC

## 2024-03-27 NOTE — Telephone Encounter (Signed)
 Pended July RF

## 2024-03-27 NOTE — Telephone Encounter (Signed)
 Pt confirmed she wants the Clonidine  sent as well to [facility] [address]

## 2024-03-27 NOTE — Telephone Encounter (Signed)
 Pt called and said Pharmacy called her that the script for July has not been sent yet.  I told her it hasn't been signed off on by [place] yet.  Confirmed pharmacy is [facility] [address]

## 2024-03-31 NOTE — Telephone Encounter (Signed)
 Clonidine  was discontinued 06/2023. LVM to [facility].

## 2024-04-01 ENCOUNTER — Telehealth: Payer: Self-pay | Admitting: Physician Assistant

## 2024-04-01 NOTE — Telephone Encounter (Signed)
 [facility] called [facility] is [facility] everything on her profile. She is not needing a refill of Clonidine .

## 2024-04-02 ENCOUNTER — Telehealth: Payer: Self-pay

## 2024-04-02 NOTE — Telephone Encounter (Signed)
 Prior Authorization Amphetamine -Dextroamphetamine  10MG  #30/30 [employer]  Approved Effective Date: 03/03/2024 Authorization Expiration Date: 04/02/2025

## 2024-06-21 ENCOUNTER — Other Ambulatory Visit: Payer: Self-pay | Admitting: Internal Medicine

## 2024-06-23 ENCOUNTER — Telehealth: Payer: Self-pay | Admitting: Physician Assistant

## 2024-06-23 NOTE — Telephone Encounter (Signed)
 Pt lvm that she needs a refill on her adderall 10 mg. The pharmacy is [facility] located at [address]. Next appt is in december

## 2024-06-23 NOTE — Telephone Encounter (Signed)
 LF 9/1, due 9/30

## 2024-06-24 ENCOUNTER — Other Ambulatory Visit: Payer: Self-pay

## 2024-06-24 DIAGNOSIS — F901 Attention-deficit hyperactivity disorder, predominantly hyperactive type: Secondary | ICD-10-CM

## 2024-06-24 MED ORDER — AMPHETAMINE-DEXTROAMPHETAMINE 10 MG PO TABS: 10.0000 mg | ORAL_TABLET | Freq: Every day | ORAL | 0 refills | Status: DC

## 2024-06-24 NOTE — Telephone Encounter (Signed)
 Pended

## 2024-09-07 ENCOUNTER — Other Ambulatory Visit: Payer: Self-pay | Admitting: Physician Assistant

## 2024-09-18 ENCOUNTER — Other Ambulatory Visit: Payer: Self-pay | Admitting: Internal Medicine

## 2024-09-22 ENCOUNTER — Telehealth: Payer: Self-pay | Admitting: Physician Assistant

## 2024-09-22 ENCOUNTER — Other Ambulatory Visit: Payer: Self-pay

## 2024-09-22 DIAGNOSIS — F901 Attention-deficit hyperactivity disorder, predominantly hyperactive type: Secondary | ICD-10-CM

## 2024-09-22 MED ORDER — AMPHETAMINE-DEXTROAMPHETAMINE 10 MG PO TABS: 10.0000 mg | ORAL_TABLET | Freq: Every day | ORAL | 0 refills | Status: DC

## 2024-09-22 NOTE — Telephone Encounter (Signed)
 Caoimhe lvm that she needs a refill on her adderall 10 mg. [place]  her appt for tomorrow. [facility] is [facility]  [address]

## 2024-09-22 NOTE — Telephone Encounter (Signed)
"  Pended   "

## 2024-09-23 ENCOUNTER — Ambulatory Visit: Admitting: Physician Assistant

## 2024-10-04 ENCOUNTER — Other Ambulatory Visit: Payer: Self-pay | Admitting: Physician Assistant

## 2024-10-17 ENCOUNTER — Ambulatory Visit: Admitting: Physician Assistant

## 2024-10-17 ENCOUNTER — Encounter: Payer: Self-pay | Admitting: Physician Assistant

## 2024-10-17 VITALS — BP 156/99 | HR 76

## 2024-10-17 DIAGNOSIS — F901 Attention-deficit hyperactivity disorder, predominantly hyperactive type: Secondary | ICD-10-CM

## 2024-10-17 MED ORDER — AMPHETAMINE-DEXTROAMPHETAMINE 10 MG PO TABS: 10.0000 mg | ORAL_TABLET | Freq: Every day | ORAL | 0 refills | Status: AC

## 2024-10-17 MED ORDER — ATOMOXETINE HCL 60 MG PO CAPS: 60.0000 mg | ORAL_CAPSULE | Freq: Every day | ORAL | 11 refills | Status: AC

## 2024-10-17 NOTE — Progress Notes (Signed)
 "     [facility]  Patient ID: Ariel Tran,  MRN: [id number]  PCP: Rollene Almarie LABOR, MD  Date of Evaluation: 10/17/2024 time spent:20 minutes  Chief Complaint:  Chief Complaint   ADHD; Follow-up    HISTORY/CURRENT STATUS: HPI For routine med check.   Is doing pretty well with the Adderall.  States that attention is good without easy distractibility.  Able to focus on things and finish tasks to completion.  Work is going well.  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  Denies any symptoms of depression.  She is able to enjoy things.  Energy and motivation are good.  Appetite is normal.  She has gained some weight lately but thinks it is menopausal.  She does exercise routinely.  She eats as healthy as she can.  ADLs and personal hygiene are normal.  Denies extreme sadness or feelings of hopelessness.  No mania, delirium, psychosis, suicidal or homicidal thoughts.  Individual Medical History/ Review of Systems: Changes? :No     Past medications for mental health diagnoses include: Adderall, ?Ritalin, Strattera , Clonidine  for a few days, it caused too much fatigue.  Allergies: Levothyroxine  sodium, Chicken allergy, Citrus, Contrast media [iodinated contrast media], Egg protein-containing drug products, Sesame seed (diagnostic), and Tramadol  Current Medications:  Current Outpatient Medications:    fexofenadine (ALLEGRA) 180 MG tablet, Take 180 mg by mouth daily., Disp: , Rfl:    fluticasone (FLONASE) 50 MCG/ACT nasal spray, Place into both nostrils daily., Disp: , Rfl:    Multiple Vitamins-Minerals (MULTIVITAMIN WITH MINERALS) tablet, Take 1 tablet by mouth daily., Disp: , Rfl:    valsartan  (DIOVAN ) 80 MG tablet, TAKE 1 TABLET BY MOUTH EVERY DAY, Disp: 90 tablet, Rfl: 0   [START ON 10/21/2024] amphetamine -dextroamphetamine  (ADDERALL) 10 MG tablet, Take 1 tablet (10 mg total) by mouth daily with breakfast., Disp: 30 tablet, Rfl: 0   [START ON 11/19/2024]  amphetamine -dextroamphetamine  (ADDERALL) 10 MG tablet, Take 1 tablet (10 mg total) by mouth daily with breakfast., Disp: 30 tablet, Rfl: 0   [START ON 12/15/2024] amphetamine -dextroamphetamine  (ADDERALL) 10 MG tablet, Take 1 tablet (10 mg total) by mouth daily with breakfast., Disp: 30 tablet, Rfl: 0   atomoxetine  (STRATTERA ) 60 MG capsule, Take 1 capsule (60 mg total) by mouth daily., Disp: 30 capsule, Rfl: 11   EPINEPHrine  0.3 mg/0.3 mL IJ SOAJ injection, Inject 0.3 mg into the muscle as needed for anaphylaxis. (Patient not taking: Reported on 01/02/2023), Disp: 1 each, Rfl: 1 Medication Side Effects: none  Family Medical/ Social History: Changes? no  MENTAL HEALTH EXAM:  Last menstrual period 08/10/2019.There is no height or weight on file to calculate BMI.     General Appearance: Casual, Neat and Well Groomed  Eye Contact:  Good  Speech:  Clear and Coherent and Normal Rate  Volume:  Normal  Mood:  Euthymic  Affect:  Congruent  Thought Process:  Goal Directed and Descriptions of Associations: Circumstantial  Orientation:  Full (Time, Place, and Person)  Thought Content: Logical   Suicidal Thoughts:  No  Homicidal Thoughts:  No  Memory:  WNL  Judgement:  Good  Insight:  Good  Psychomotor Activity:  Normal  Concentration:  Concentration: Good and Attention Span: Good  Recall:  Good  Fund of Knowledge: Good  Language: Good  Assets:  Communication Skills Desire for Improvement Financial Resources/Insurance Housing Resilience Social Support Transportation Vocational/Educational  ADL's:  Intact  Cognition: WNL  Prognosis:  Good   DIAGNOSES:  ICD-10-CM   1. Attention deficit hyperactivity disorder (ADHD), predominantly hyperactive type  F90.1 amphetamine -dextroamphetamine  (ADDERALL) 10 MG tablet    amphetamine -dextroamphetamine  (ADDERALL) 10 MG tablet    amphetamine -dextroamphetamine  (ADDERALL) 10 MG tablet     Receiving Psychotherapy: No   RECOMMENDATIONS:  PDMP  reviewed.  Adderall filled 09/22/2024.  Xanax  filled 07/03/2023.   I provided approximately 20 minutes of face to face time during this encounter, including time spent before and after the visit in records review, medical decision making, counseling pertinent to today's visit, and charting.   She is doing well on the current medications so no changes will be made.  Continue Adderall 10 mg, 1 daily a.m. Continue Strattera  60 mg, 1 every day.  Return in 6 months.   Verneita Cooks, PA-C  "

## 2024-10-23 ENCOUNTER — Encounter: Payer: Self-pay | Admitting: *Deleted

## 2024-10-23 NOTE — Progress Notes (Signed)
 KANSAS SPAINHOWER                                          MRN: [id number]   10/23/2024   The [facility] Quality Team Specialist reviewed this patient medical record for the purposes of chart review for care gap closure. The following were reviewed: chart review for care gap closure-controlling blood pressure.    [facility] Quality Team

## 2025-04-16 ENCOUNTER — Ambulatory Visit: Admitting: Physician Assistant
